# Patient Record
Sex: Female | Born: 1995 | Hispanic: Yes | Marital: Married | Smoking: Never smoker
Health system: Southern US, Community
[De-identification: ages and names within clinical notes are randomized; demographics above are authoritative.]

## PROBLEM LIST (undated history)

## (undated) DIAGNOSIS — R634 Abnormal weight loss: Secondary | ICD-10-CM

## (undated) DIAGNOSIS — F32A Depression, unspecified: Secondary | ICD-10-CM

## (undated) DIAGNOSIS — R109 Unspecified abdominal pain: Secondary | ICD-10-CM

## (undated) HISTORY — DX: Unspecified abdominal pain: R10.9

## (undated) HISTORY — DX: Abnormal weight loss: R63.4

---
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
## 2018-04-16 ENCOUNTER — Ambulatory Visit (HOSPITAL_COMMUNITY)
Admission: EM | Admit: 2018-04-16 | Discharge: 2018-04-16 | Disposition: A | Discharge status: Home / Self Care | Payer: Medicaid Other | Attending: Internal Medicine | Admitting: Internal Medicine

## 2018-04-16 ENCOUNTER — Ambulatory Visit (INDEPENDENT_AMBULATORY_CARE_PROVIDER_SITE_OTHER): Payer: Medicaid Other

## 2018-04-16 ENCOUNTER — Telehealth (HOSPITAL_COMMUNITY): Payer: Self-pay | Admitting: Family Medicine

## 2018-04-16 ENCOUNTER — Encounter (HOSPITAL_COMMUNITY): Payer: Self-pay | Admitting: Family Medicine

## 2018-04-16 DIAGNOSIS — J209 Acute bronchitis, unspecified: Secondary | ICD-10-CM | POA: Diagnosis not present

## 2018-04-16 DIAGNOSIS — R109 Unspecified abdominal pain: Secondary | ICD-10-CM | POA: Diagnosis not present

## 2018-04-16 DIAGNOSIS — Z882 Allergy status to sulfonamides status: Secondary | ICD-10-CM | POA: Diagnosis not present

## 2018-04-16 DIAGNOSIS — R5383 Other fatigue: Secondary | ICD-10-CM | POA: Diagnosis not present

## 2018-04-16 DIAGNOSIS — Z79899 Other long term (current) drug therapy: Secondary | ICD-10-CM | POA: Diagnosis not present

## 2018-04-16 DIAGNOSIS — R05 Cough: Secondary | ICD-10-CM | POA: Diagnosis present

## 2018-04-16 LAB — POCT RAPID STREP A: STREPTOCOCCUS, GROUP A SCREEN (DIRECT): NEGATIVE

## 2018-04-16 MED ORDER — PREDNISONE 50 MG PO TABS: 50.0000 mg | ORAL_TABLET | Freq: Every day | ORAL | 0 refills | Status: AC

## 2018-04-16 MED ORDER — FLUTICASONE PROPIONATE 50 MCG/ACT NA SUSP: 1.0000 | Freq: Every day | NASAL | 0 refills | Status: DC

## 2018-04-16 MED ORDER — ACETAMINOPHEN 325 MG PO TABS
ORAL_TABLET | ORAL | Status: AC
  Filled 2018-04-16: qty 2

## 2018-04-16 MED ORDER — CETIRIZINE HCL 10 MG PO CAPS: 10.0000 mg | ORAL_CAPSULE | Freq: Every day | ORAL | 0 refills | Status: DC

## 2018-04-16 MED ORDER — ALBUTEROL SULFATE HFA 108 (90 BASE) MCG/ACT IN AERS: 1.0000 | INHALATION_SPRAY | Freq: Four times a day (QID) | RESPIRATORY_TRACT | 0 refills | Status: DC | PRN

## 2018-04-16 MED ORDER — IPRATROPIUM-ALBUTEROL 0.5-2.5 (3) MG/3ML IN SOLN
3.0000 mL | Freq: Once | RESPIRATORY_TRACT | Status: AC
  Administered 2018-04-16: 3 mL via RESPIRATORY_TRACT

## 2018-04-16 MED ORDER — PSEUDOEPH-BROMPHEN-DM 30-2-10 MG/5ML PO SYRP: 5.0000 mL | ORAL_SOLUTION | Freq: Four times a day (QID) | ORAL | 0 refills | Status: DC | PRN

## 2018-04-16 MED ORDER — ACETAMINOPHEN 325 MG PO TABS
650.0000 mg | ORAL_TABLET | Freq: Once | ORAL | Status: AC
  Administered 2018-04-16: 650 mg via ORAL

## 2018-04-16 MED ORDER — PREDNISONE 50 MG PO TABS: 50.0000 mg | ORAL_TABLET | Freq: Every day | ORAL | 0 refills | Status: DC

## 2018-04-16 MED ORDER — IPRATROPIUM-ALBUTEROL 0.5-2.5 (3) MG/3ML IN SOLN
RESPIRATORY_TRACT | Status: AC
  Filled 2018-04-16: qty 3

## 2018-04-16 NOTE — ED Triage Notes (Addendum)
Pt here for runny nose, cough, abd pain and coughing up mucous x 3 days. Pt is taking fluconazole, acyclovir, azithromycin and ferrous sulfate. She is being treated for PNA and was seen like a week ago at [facility].

## 2018-04-16 NOTE — Discharge Instructions (Signed)
Please use prednisone daily for the next 5 days to help with asthma and wheezing.  Please use albuterol inhaler as needed for wheezing.  Please begin daily Zyrtec and Flonase nasal spray to help with congestion.  For cough please use cough syrup provided or over-the-counter Delsym or Robitussin.  Please mix honey and warm tea.  Please alternate Tylenol and ibuprofen every 4 hours to control fever and headache.  Please return if symptoms not improving or symptoms are worsening.

## 2018-04-16 NOTE — ED Provider Notes (Signed)
[facility]    CSN: [id number] Arrival date & time: 04/16/18  1519     History   Chief Complaint Chief Complaint  Patient presents with  . Cough  . Abdominal Pain    HPI Denise Mann is a 22 y.o. female presenting today for evaluation of rhinorrhea, cough, mucus production and fatigue.  Symptoms began 3 days ago.  Patient is also having abdominal pain and chest discomfort.  Decreased appetite.  [facility] and is taking fluconazole, acyclovir, azithromycin and ferrous sulfate daily.  She states that the azithromycin is for a month.  Patient and mom are unclear on why she is on these medicines, but states that it is "unconventional" medicine.  Patient has not tried anything for her symptoms.  She denies any nausea or vomiting, denies any constipation or change in bowel movements.  HPI  Past Medical History:  Diagnosis Date  . Abdominal pain, recurrent   . Weight loss     Patient Active Problem List   Diagnosis Date Noted  . 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 Nausea & vomiting 01/29/2012  . Poor appetite 01/29/2012  . Lower abdominal pain   . Weight loss     History reviewed. No pertinent surgical history.  OB History   None      Home Medications    Prior to Admission medications   Medication Sig Start Date End Date Taking? Authorizing Provider  albuterol (PROVENTIL HFA;VENTOLIN HFA) 108 (90 Base) MCG/ACT inhaler Inhale 1-2 puffs into the lungs every 6 (six) hours as needed for wheezing or shortness of breath. 04/16/18   Lilyth Lawyer C, PA-C  brompheniramine-pseudoephedrine-DM 30-2-10 MG/5ML syrup Take 5 mLs by mouth 4 (four) times daily as needed. 04/16/18   Narada Uzzle C, PA-C  Cetirizine HCl 10 MG CAPS Take 1 capsule (10 mg total) by mouth daily for 15 days. 04/16/18 05/01/18  Kaijah Abts C, PA-C  fluticasone (FLONASE) 50 MCG/ACT nasal spray Place 1 spray  into both nostrils daily for 7 days. 04/16/18 04/23/18  Dustine Bertini C, PA-C  Fluticasone-Salmeterol (ADVAIR) 100-50 MCG/DOSE AEPB Inhale 1 puff into the lungs every 12 (twelve) hours.    [provider]  lactase (LACTAID) 3000 UNITS tablet Take 1 tablet (3,000 Units total) by mouth 3 (three) times daily with meals. 08/10/12 08/10/13  Jon Gillslark, Joseph H, MD  levocetirizine (XYZAL) 5 MG tablet Take 5 mg by mouth daily.  01/08/12   [provider]  metoCLOPramide (REGLAN) 10 MG tablet Take 1 tablet (10 mg total) by mouth every 6 (six) hours. 04/28/16   Janne NapoleonNeese, Hope M, NP  montelukast (SINGULAIR) 10 MG tablet Take 10 mg by mouth at bedtime.     [provider]  predniSONE (DELTASONE) 50 MG tablet Take 1 tablet (50 mg total) by mouth daily for 5 days. 04/16/18 04/21/18  Ercel Pepitone, Junius CreamerHallie C, PA-C    Family History History reviewed. No pertinent family history.  Social History Social History   Tobacco Use  . Smoking status: Never Smoker  . Smokeless tobacco: Never Used  Substance Use Topics  . Alcohol use: Not on file  . Drug use: Not on file     Allergies   Sulfa antibiotics   Review of Systems Review of Systems  Constitutional: Positive for fever. Negative for chills and fatigue.  HENT: Positive for congestion, rhinorrhea, sinus pressure and sore throat. Negative for ear pain and trouble swallowing.   Respiratory:  Positive for cough. Negative for chest tightness and shortness of breath.   Cardiovascular: Negative for chest pain.  Gastrointestinal: Positive for abdominal pain. Negative for nausea and vomiting.  Musculoskeletal: Negative for myalgias.  Skin: Negative for rash.  Neurological: Negative for dizziness, light-headedness and headaches.     Physical Exam Triage Vital Signs ED Triage Vitals  Enc Vitals Group     BP 04/16/18 1545 102/85     Pulse Rate 04/16/18 1545 (!) 131     Resp 04/16/18 1545 18     Temp 04/16/18 1545 (!) 102.5 F (39.2 C)     Temp  src --      SpO2 04/16/18 1545 100 %     Weight --      Height --      Head Circumference --      Peak Flow --      Pain Score 04/16/18 1541 5     Pain Loc --      Pain Edu? --      Excl. in GC? --    No data found.  Updated Vital Signs BP 102/85   Pulse (!) 131   Temp (!) 102.5 F (39.2 C)   Resp 18   SpO2 100%   Visual Acuity Right Eye Distance:   Left Eye Distance:   Bilateral Distance:    Right Eye Near:   Left Eye Near:    Bilateral Near:     Physical Exam  Constitutional: She appears well-developed and well-nourished. No distress.  Sitting on exam table, appears slightly uncomfortable, but no acute distress  HENT:  Head: Normocephalic and atraumatic.  Bilateral TMs nonerythematous, nasal mucosa erythematous, clear rhinorrhea present, posterior oropharynx erythematous, no tonsillar enlargement or exudate.  Eyes: Conjunctivae are normal.  Neck: Neck supple.  No cervical lymphadenopathy  Cardiovascular: Normal rate and regular rhythm.  No murmur heard. Pulmonary/Chest: Effort normal and breath sounds normal. No respiratory distress.  Breathing comfortably at rest, mild inconsistent wheezing throughout bilateral lung fields  Abdominal: Soft. There is no tenderness.  Nontender to light and deep palpation  Musculoskeletal: She exhibits no edema.  Neurological: She is alert.  Skin: Skin is warm and dry.  Psychiatric: She has a normal mood and affect.  Nursing note and vitals reviewed.    UC Treatments / Results  Labs (all labs ordered are listed, but only abnormal results are displayed) Labs Reviewed  CULTURE, GROUP A STREP [facility])  POCT RAPID STREP A    EKG None Radiology Dg Chest 2 View  Result Date: 04/16/2018 CLINICAL DATA:  Cough and chest pain EXAM: CHEST - 2 VIEW COMPARISON:  June 05, 2012 FINDINGS: There is no edema or consolidation. The heart size and pulmonary vascularity are normal. No adenopathy. No bone lesions. No pneumothorax. IMPRESSION:  No edema or consolidation. Electronically Signed   By: Bretta Bang III M.D.   On: 04/16/2018 16:58    Procedures Procedures (including critical care time)  Medications Ordered in UC Medications  acetaminophen (TYLENOL) tablet 650 mg (650 mg Oral Given 04/16/18 1550)  ipratropium-albuterol (DUONEB) 0.5-2.5 (3) MG/3ML nebulizer solution 3 mL (3 mLs Nebulization Given 04/16/18 1656)     Initial Impression / Assessment and Plan / UC Course  I have reviewed the triage vital signs and the nursing notes.  Pertinent labs & imaging results that were available during my care of the patient were reviewed by me and considered in my medical decision making (see chart for details).  Strep test negative, chest x-ray negative for pneumonia.  Patient with fever and tachycardia, wheezing on exam, breathing treatment provided in clinic.  Improvement of wheezing after administration.  At this time will begin treatment for bronchitis with albuterol inhaler, prednisone as well as Zyrtec and Flonase for congestion.  Cough syrup provided for cough.  Will have patient monitor temperatures temperature.  Return if symptoms not improving in 3 to 4 days.  Return if symptoms worsening. Discussed strict return precautions. Patient verbalized understanding and is agreeable with plan.   Final Clinical Impressions(s) / UC Diagnoses   Final diagnoses:  Acute bronchitis, unspecified organism    ED Discharge Orders        Ordered    albuterol (PROVENTIL HFA;VENTOLIN HFA) 108 (90 Base) MCG/ACT inhaler  Every 6 hours PRN,   Status:  Discontinued     04/16/18 1712    predniSONE (DELTASONE) 50 MG tablet  Daily,   Status:  Discontinued     04/16/18 1712    Cetirizine HCl 10 MG CAPS  Daily,   Status:  Discontinued     04/16/18 1712    fluticasone (FLONASE) 50 MCG/ACT nasal spray  Daily,   Status:  Discontinued     04/16/18 1712    brompheniramine-pseudoephedrine-DM 30-2-10 MG/5ML syrup  4 times daily PRN,    Status:  Discontinued     04/16/18 1712       Controlled Substance Prescriptions [place] Controlled Substance Registry consulted? Not Applicable   Lew Dawes, [place] 04/17/18 1712

## 2018-04-19 LAB — CULTURE, GROUP A STREP (THRC)

## 2018-11-13 ENCOUNTER — Ambulatory Visit (INDEPENDENT_AMBULATORY_CARE_PROVIDER_SITE_OTHER): Payer: Medicaid Other

## 2018-11-13 ENCOUNTER — Encounter (HOSPITAL_COMMUNITY): Payer: Self-pay

## 2018-11-13 ENCOUNTER — Ambulatory Visit (HOSPITAL_COMMUNITY)
Admission: EM | Admit: 2018-11-13 | Discharge: 2018-11-13 | Disposition: A | Discharge status: Home / Self Care | Payer: Medicaid Other | Attending: Family Medicine | Admitting: Family Medicine

## 2018-11-13 DIAGNOSIS — B9789 Other viral agents as the cause of diseases classified elsewhere: Secondary | ICD-10-CM

## 2018-11-13 DIAGNOSIS — J069 Acute upper respiratory infection, unspecified: Secondary | ICD-10-CM

## 2018-11-13 MED ORDER — DM-GUAIFENESIN ER 30-600 MG PO TB12: 1.0000 | ORAL_TABLET | Freq: Two times a day (BID) | ORAL | 0 refills | Status: DC

## 2018-11-13 MED ORDER — ALBUTEROL SULFATE HFA 108 (90 BASE) MCG/ACT IN AERS: 1.0000 | INHALATION_SPRAY | Freq: Four times a day (QID) | RESPIRATORY_TRACT | 0 refills | Status: DC | PRN

## 2018-11-13 NOTE — ED Provider Notes (Signed)
[facility]    CSN: [id number] Arrival date & time: 11/13/18  1309     History   Chief Complaint Chief Complaint  Patient presents with  . Cough    HPI Ebbie Ridge is a 22 y.o. female.   Patient is a 22 year old female who presents for cough, congestion, shortness of breath, back pain x5 days.  She does have a past medical history of asthma.  She has been using her Q var inhaler with no relief. She is out of her rescue inhaler.   Her symptoms have been constant and worsening.  She has not taken any medication for cough, congestion.  She says some nasal congestion and rhinorrhea.  She denies any specific fever, body aches, chills.  She denies any recent sick contacts.  ROS per HPI      Past Medical History:  Diagnosis Date  . Abdominal pain, recurrent   . Weight loss     Patient Active Problem List   Diagnosis Date Noted  . 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 Nausea & vomiting 01/29/2012  . Poor appetite 01/29/2012  . Lower abdominal pain   . Weight loss     History reviewed. No pertinent surgical history.  OB History   None      Home Medications    Prior to Admission medications   Medication Sig Start Date End Date Taking? Authorizing Provider  albuterol (PROVENTIL HFA;VENTOLIN HFA) 108 (90 Base) MCG/ACT inhaler Inhale 1-2 puffs into the lungs every 6 (six) hours as needed for wheezing or shortness of breath. 11/13/18   Dahlia Byes A, NP  brompheniramine-pseudoephedrine-DM 30-2-10 MG/5ML syrup Take 5 mLs by mouth 4 (four) times daily as needed. 04/16/18   Wieters, Hallie C, PA-C  Cetirizine HCl 10 MG CAPS Take 1 capsule (10 mg total) by mouth daily for 15 days. 04/16/18 05/01/18  Wieters, Hallie C, PA-C  dextromethorphan-guaiFENesin (MUCINEX DM) 30-600 MG 12hr tablet Take 1 tablet by mouth 2 (two) times daily. 11/13/18   Tamyra Fojtik, Gloris Manchester A, NP  fluticasone (FLONASE) 50 MCG/ACT nasal spray Place 1  spray into both nostrils daily for 7 days. 04/16/18 04/23/18  Wieters, Hallie C, PA-C  Fluticasone-Salmeterol (ADVAIR) 100-50 MCG/DOSE AEPB Inhale 1 puff into the lungs every 12 (twelve) hours.    [provider]  lactase (LACTAID) 3000 UNITS tablet Take 1 tablet (3,000 Units total) by mouth 3 (three) times daily with meals. 08/10/12 08/10/13  Jon Gills, MD  levocetirizine (XYZAL) 5 MG tablet Take 5 mg by mouth daily.  01/08/12   [provider]  metoCLOPramide (REGLAN) 10 MG tablet Take 1 tablet (10 mg total) by mouth every 6 (six) hours. 04/28/16   Janne Napoleon, NP  montelukast (SINGULAIR) 10 MG tablet Take 10 mg by mouth at bedtime.     [provider]    Family History Family History  Problem Relation Age of Onset  . Hypertension Mother   . Healthy Father     Social History Social History   Tobacco Use  . Smoking status: Never Smoker  . Smokeless tobacco: Never Used  Substance Use Topics  . Alcohol use: Not on file  . Drug use: Not on file     Allergies   Sulfa antibiotics   Review of Systems Review of Systems   Physical Exam Triage Vital Signs ED Triage Vitals  Enc Vitals Group     BP 11/13/18 1336 102/67     Pulse Rate  11/13/18 1336 73     Resp 11/13/18 1336 20     Temp 11/13/18 1336 98.3 F (36.8 C)     Temp src --      SpO2 11/13/18 1336 99 %     Weight --      Height --      Head Circumference --      Peak Flow --      Pain Score 11/13/18 1333 5     Pain Loc --      Pain Edu? --      Excl. in GC? --    No data found.  Updated Vital Signs BP 102/67   Pulse 73   Temp 98.3 F (36.8 C)   Resp 20   LMP 11/06/2018   SpO2 99%   Visual Acuity Right Eye Distance:   Left Eye Distance:   Bilateral Distance:    Right Eye Near:   Left Eye Near:    Bilateral Near:     Physical Exam  Constitutional: She appears well-developed and well-nourished.  HENT:  Head: Normocephalic and atraumatic.  Right Ear: External ear  normal.  Left Ear: External ear normal.  Nose: Nose normal.  Mouth/Throat: Oropharynx is clear and moist. No oropharyngeal exudate.  Eyes: Conjunctivae are normal.  Neck: Normal range of motion.  Cardiovascular: Normal rate, regular rhythm and normal heart sounds.  Pulmonary/Chest: Effort normal. No respiratory distress. She has no wheezes. She has no rales.  Decreased lower lung sounds, bilateral.   Musculoskeletal: Normal range of motion.  Lymphadenopathy:    She has no cervical adenopathy.  Neurological: She is alert.  Skin: Skin is warm and dry.  Psychiatric: She has a normal mood and affect.  Nursing note and vitals reviewed.    UC Treatments / Results  Labs (all labs ordered are listed, but only abnormal results are displayed) Labs Reviewed - No data to display  EKG None  Radiology Dg Chest 2 View  Result Date: 11/13/2018 CLINICAL DATA:  Shortness of breath, back pain, cough EXAM: CHEST - 2 VIEW COMPARISON:  04/16/2018 FINDINGS: Heart and mediastinal contours are within normal limits. No focal opacities or effusions. No acute bony abnormality. IMPRESSION: No active cardiopulmonary disease. Electronically Signed   By: Charlett NoseKevin  Dover M.D.   On: 11/13/2018 14:41    Procedures Procedures (including critical care time)  Medications Ordered in UC Medications - No data to display  Initial Impression / Assessment and Plan / UC Course  I have reviewed the triage vital signs and the nursing notes.  Pertinent labs & imaging results that were available during my care of the patient were reviewed by me and considered in my medical decision making (see chart for details).     X-ray was negative for any pneumonia, bronchitis. This is most likely a viral illness that is flaring her asthma. She has been using her Qvar inhaler without any relief We will give her an albuterol rescue inhaler to use for cough, wheezing, shortness of breath. Mucinex DM for cough,  congestion Instructed to continue her allergy medication Follow up as needed for continued or worsening symptoms  Final Clinical Impressions(s) / UC Diagnoses   Final diagnoses:  Viral URI with cough     Discharge Instructions     Your x ray was normal.  I believe this is viral related. Mucinex can help with the cough and congestion.  Keep using your inhalers as prescribed.  Keep using the allergy medication.  Follow up  as needed for continued or worsening symptoms     ED Prescriptions    Medication Sig Dispense Auth. Provider   albuterol (PROVENTIL HFA;VENTOLIN HFA) 108 (90 Base) MCG/ACT inhaler Inhale 1-2 puffs into the lungs every 6 (six) hours as needed for wheezing or shortness of breath. 1 Inhaler Hartleton Ducre A, NP   dextromethorphan-guaiFENesin (MUCINEX DM) 30-600 MG 12hr tablet Take 1 tablet by mouth 2 (two) times daily. 15 tablet Dahlia Byes A, NP     Controlled Substance Prescriptions  Controlled Substance Registry consulted? Not Applicable   Janace Aris, NP 11/13/18 1514

## 2018-11-13 NOTE — Discharge Instructions (Addendum)
Your x ray was normal.  I believe this is viral related. Mucinex can help with the cough and congestion.  Keep using your inhalers as prescribed.  Keep using the allergy medication.  Follow up as needed for continued or worsening symptoms

## 2018-11-13 NOTE — ED Triage Notes (Signed)
Pt presents with cough x 5 days. Reports inhaler is not helping at home.

## 2019-07-31 ENCOUNTER — Encounter (HOSPITAL_COMMUNITY): Payer: Self-pay

## 2019-07-31 ENCOUNTER — Other Ambulatory Visit: Payer: Self-pay

## 2019-07-31 ENCOUNTER — Emergency Department (HOSPITAL_COMMUNITY)
Admission: EM | Admit: 2019-07-31 | Discharge: 2019-07-31 | Disposition: A | Discharge status: Home / Self Care | Payer: BC Managed Care – PPO | Attending: Emergency Medicine | Admitting: Emergency Medicine

## 2019-07-31 DIAGNOSIS — R311 Benign essential microscopic hematuria: Secondary | ICD-10-CM

## 2019-07-31 DIAGNOSIS — Z202 Contact with and (suspected) exposure to infections with a predominantly sexual mode of transmission: Secondary | ICD-10-CM | POA: Diagnosis not present

## 2019-07-31 DIAGNOSIS — R1033 Periumbilical pain: Secondary | ICD-10-CM | POA: Insufficient documentation

## 2019-07-31 DIAGNOSIS — N390 Urinary tract infection, site not specified: Secondary | ICD-10-CM | POA: Insufficient documentation

## 2019-07-31 DIAGNOSIS — R10814 Left lower quadrant abdominal tenderness: Secondary | ICD-10-CM | POA: Diagnosis not present

## 2019-07-31 DIAGNOSIS — R350 Frequency of micturition: Secondary | ICD-10-CM | POA: Diagnosis present

## 2019-07-31 DIAGNOSIS — R300 Dysuria: Secondary | ICD-10-CM | POA: Insufficient documentation

## 2019-07-31 LAB — WET PREP, GENITAL
Clue Cells Wet Prep HPF POC: NONE SEEN
Sperm: NONE SEEN
Trich, Wet Prep: NONE SEEN
Yeast Wet Prep HPF POC: NONE SEEN

## 2019-07-31 LAB — URINALYSIS, ROUTINE W REFLEX MICROSCOPIC
Bilirubin Urine: NEGATIVE
Glucose, UA: NEGATIVE mg/dL
Ketones, ur: NEGATIVE mg/dL
Nitrite: NEGATIVE
Protein, ur: NEGATIVE mg/dL
Specific Gravity, Urine: 1.024 (ref 1.005–1.030)
WBC, UA: >50 WBC/hpf — ABNORMAL HIGH (ref 0–5)
pH: 6 (ref 5.0–8.0)

## 2019-07-31 LAB — I-STAT BETA HCG BLOOD, ED (MC, WL, AP ONLY): I-stat hCG, quantitative: <5 m[IU]/mL (ref <5)

## 2019-07-31 LAB — RPR: RPR Ser Ql: NONREACTIVE

## 2019-07-31 LAB — HIV ANTIBODY (ROUTINE TESTING W REFLEX): HIV Screen 4th Generation wRfx: NONREACTIVE

## 2019-07-31 MED ORDER — CEPHALEXIN 500 MG PO CAPS: 500.0000 mg | ORAL_CAPSULE | Freq: Two times a day (BID) | ORAL | 0 refills | Status: AC

## 2019-07-31 NOTE — ED Provider Notes (Signed)
[facility]-EMERGENCY DEPT Provider Note   CSN: [id number] Arrival date & time: 07/31/19  [id number]    History   Chief Complaint Chief Complaint  Patient presents with  . Urinary Frequency    HPI Denise Mann is a 23 y.o. female who presents to the ED today complaining of gradual onset, intermittent, throbbing, suprapubic abdominal pain, urinary frequency, urgency, and dysuria that began this morning. Pt also complains of a constant burning to her vaginal area. Pt reports she has had a UTI in the past and this feels similar. She is sexually active with 1 female partner; they do not use protection. She would like to be tested for STIs today. LNMP 1 week ago. Denies fever, chills, nausea, vomiting, diarrhea, constipation, vaginal discharge, pelvic pain, or any other associated symptoms.        Past Medical History:  Diagnosis Date  . Abdominal pain, recurrent   . Weight loss     Patient Active Problem List   Diagnosis Date Noted  . 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 Nausea & vomiting 01/29/2012  . Poor appetite 01/29/2012  . Lower abdominal pain   . Weight loss     No past surgical history on file.   OB History   No obstetric history on file.      Home Medications    Prior to Admission medications   Medication Sig Start Date End Date Taking? Authorizing Provider  Norethindrone-Ethinyl Estradiol-Fe Biphas (LO LOESTRIN FE) 1 MG-10 MCG / 10 MCG tablet Take 1 tablet by mouth daily.    Yes [provider]  albuterol (PROVENTIL HFA;VENTOLIN HFA) 108 (90 Base) MCG/ACT inhaler Inhale 1-2 puffs into the lungs every 6 (six) hours as needed for wheezing or shortness of breath. Patient not taking: Reported on 07/31/2019 11/13/18   Dahlia ByesBast, Traci A, NP  brompheniramine-pseudoephedrine-DM 30-2-10 MG/5ML syrup Take 5 mLs by mouth 4 (four) times daily as needed. Patient not taking: Reported on 07/31/2019 04/16/18    Wieters, Hallie C, PA-C  cephALEXin (KEFLEX) 500 MG capsule Take 1 capsule (500 mg total) by mouth 2 (two) times daily for 7 days. 07/31/19 08/07/19  Tanda RockersVenter, Starletta Houchin, PA-C  Cetirizine HCl 10 MG CAPS Take 1 capsule (10 mg total) by mouth daily for 15 days. Patient not taking: Reported on 07/31/2019 04/16/18 05/01/18  Wieters, Junius CreamerHallie C, PA-C  dextromethorphan-guaiFENesin (MUCINEX DM) 30-600 MG 12hr tablet Take 1 tablet by mouth 2 (two) times daily. Patient not taking: Reported on 07/31/2019 11/13/18   Dahlia ByesBast, Traci A, NP  fluticasone (FLONASE) 50 MCG/ACT nasal spray Place 1 spray into both nostrils daily for 7 days. Patient not taking: Reported on 07/31/2019 04/16/18 04/23/18  Wieters, Hallie C, PA-C  lactase (LACTAID) 3000 UNITS tablet Take 1 tablet (3,000 Units total) by mouth 3 (three) times daily with meals. Patient not taking: Reported on 07/31/2019 08/10/12 08/10/13  Jon Gillslark, Joseph H, MD  metoCLOPramide (REGLAN) 10 MG tablet Take 1 tablet (10 mg total) by mouth every 6 (six) hours. Patient not taking: Reported on 07/31/2019 04/28/16   Janne NapoleonNeese, Hope M, NP    Family History Family History  Problem Relation Age of Onset  . Hypertension Mother   . Healthy Father     Social History Social History   Tobacco Use  . Smoking status: Never Smoker  . Smokeless tobacco: Never Used  Substance Use Topics  . Alcohol use: Never    Frequency: Never  . Drug use: Never  Allergies   Sulfa antibiotics   Review of Systems Review of Systems  Constitutional: Negative for chills and fever.  HENT: Negative for congestion.   Eyes: Negative for visual disturbance.  Respiratory: Negative for cough.   Cardiovascular: Negative for chest pain.  Gastrointestinal: Positive for abdominal pain. Negative for constipation, diarrhea, nausea and vomiting.  Genitourinary: Positive for dysuria, frequency and urgency. Negative for pelvic pain, vaginal bleeding and vaginal discharge.  Musculoskeletal: Negative for myalgias.  Skin:  Negative for rash.  Neurological: Negative for headaches.     Physical Exam Updated Vital Signs BP 110/77 (BP Location: Left Arm)   Pulse 76   Temp 98.2 F (36.8 C) (Oral)   Resp 16   Ht 4\' 11"  (1.499 m)   Wt 45.4 kg   LMP 07/24/2019 (Approximate)   SpO2 94%   BMI 20.20 kg/m   Physical Exam Vitals signs and nursing note reviewed.  Constitutional:      Appearance: She is not ill-appearing.  HENT:     Head: Normocephalic and atraumatic.  Eyes:     Conjunctiva/sclera: Conjunctivae normal.  Neck:     Musculoskeletal: Neck supple.  Cardiovascular:     Rate and Rhythm: Normal rate and regular rhythm.  Pulmonary:     Effort: Pulmonary effort is normal.     Breath sounds: Normal breath sounds.  Abdominal:     Palpations: Abdomen is soft.     Tenderness: There is abdominal tenderness. There is no guarding or rebound.     Comments: Soft abdomen, tenderness present to LLQ and suprapubic area, +BS throughout, no r/g/r, neg murphy's, neg mcburney's, no CVA TTP   Genitourinary:    Comments: Chaperone present for exam. No rashes, lesions, or tenderness to external genitalia. No erythema, injury, or tenderness to vaginal mucosa. No vaginal discharge or bleeding within vaginal vault. No adnexal masses, tenderness, or fullness. No CMT, cervical friability, or discharge from cervical os. Cervical os is closed. Uterus non-deviated, mobile, nonTTP, and without enlargement.   Skin:    General: Skin is warm and dry.  Neurological:     Mental Status: She is alert.      ED Treatments / Results  Labs (all labs ordered are listed, but only abnormal results are displayed) Labs Reviewed  WET PREP, GENITAL - Abnormal; Notable for the following components:      Result Value   WBC, Wet Prep HPF POC MODERATE (*)    All other components within normal limits  URINALYSIS, ROUTINE W REFLEX MICROSCOPIC - Abnormal; Notable for the following components:   APPearance HAZY (*)    Hgb urine  dipstick LARGE (*)    Leukocytes,Ua SMALL (*)    WBC, UA >50 (*)    Bacteria, UA RARE (*)    All other components within normal limits  URINE CULTURE  HIV ANTIBODY (ROUTINE TESTING W REFLEX)  RPR  I-STAT BETA HCG BLOOD, ED ([facility], [facility], [facility] ONLY)  GC/CHLAMYDIA PROBE AMP ([place]) NOT AT [facility]    EKG None  Radiology No results found.  Procedures Procedures (including critical care time)  Medications Ordered in ED Medications - No data to display   Initial Impression / Assessment and Plan / ED Course  I have reviewed the triage vital signs and the nursing notes.  Pertinent labs & imaging results that were available during my care of the patient were reviewed by me and considered in my medical decision making (see chart for details).    23 year old female  presenting to the ED today with complaints of suprapubic pain, dysuria, frequency, and a burning sensation to her vaginal area x 1 day. She is sexually active with 1 female partner - does not use protection. Would like to be tested for STIs today. No complaints of pelvic pain to warrant pelvic ultrasound today although will reevaluate during pelvic exam. No peritoneal signs; doubt acute abdomen today. Will obtain U/A as well as STI panel.   U/A with small leuks, > 50 WBCs per HPF, and 21-50 RBCs per HPF. Pt recently stopped her menses; suspect blood is present due to that. She has no CVA tenderness on exam. No nausea, vomiting. No hx of kidney stones. Very low suspicion for same. Will send for culture and treat as UTI today with keflex. Wet prep negative today. Pt advised she will receive a call in 2-3 days if she tests positive for STIs. She is to follow up with her PCP. She is in agreement with plan at this time and stable for discharge home.        Final Clinical Impressions(s) / ED Diagnoses   Final diagnoses:  Lower urinary tract infectious disease  Dysuria  Benign essential microscopic hematuria    ED Discharge Orders          Ordered    cephALEXin (KEFLEX) 500 MG capsule  2 times daily     07/31/19 0926           Eustaquio Maize, PA-C 07/31/19 1607    Julianne Rice, MD 08/06/19 1751

## 2019-07-31 NOTE — Discharge Instructions (Signed)
You were seen in the ED today for burning when you pee and peeing more often than normal  We have tested you for STIs today - you will receive a call in 2-3 days if any of the tests return positive. If they are negative you will not receive a call.  Your urine appeared infectious today. We have sent it for culture to see what bacteria it grows/if the antibiotic you are prescribed is affective. You will receive a call in 2-3 days if it needs to be changed.  Please take antibiotic as prescribed.  If no improvement in symptoms please follow up with your PCP.  Return to the ED immediately for any worsening symptoms including worsening pain, nausea, vomiting, fever > 100.4, pelvic pain.

## 2019-07-31 NOTE — ED Triage Notes (Signed)
Pt presents with c/o urinary frequency and urgency that started this morning. Pt reports burning when she urinates. Pt denies any hematuria or hx of kidney stones.

## 2019-08-02 LAB — URINE CULTURE: Culture: >=100000 — AB

## 2019-08-03 ENCOUNTER — Telehealth: Payer: Self-pay | Admitting: Emergency Medicine

## 2019-08-03 LAB — GC/CHLAMYDIA PROBE AMP (~~LOC~~) NOT AT ARMC
Chlamydia: NEGATIVE
Neisseria Gonorrhea: NEGATIVE

## 2019-08-03 NOTE — Telephone Encounter (Signed)
Post ED Visit - Positive Culture Follow-up  Culture report reviewed by antimicrobial stewardship pharmacist: [facility] Team []  Elenor Quinones, Pharm.D. []  Heide Guile, Pharm.D., BCPS AQ-ID []  Parks Neptune, Pharm.D., BCPS []  Alycia Rossetti, Pharm.D., BCPS []  [place], [place].D., BCPS, AAHIVP []  Legrand Como, Pharm.D., BCPS, AAHIVP []  Salome Arnt, PharmD, BCPS []  Johnnette Gourd, PharmD, BCPS []  Hughes Better, PharmD, BCPS []  Leeroy Cha, PharmD []  Laqueta Linden, PharmD, BCPS []  Albertina Parr, PharmD  [facility] Team []  Leodis Sias, PharmD []  Lindell Spar, PharmD []  Royetta Asal, PharmD []  Graylin Shiver, Rph []  Rema Fendt) Glennon Mac, PharmD []  Arlyn Dunning, PharmD []  Netta Cedars, PharmD []  Dia Sitter, PharmD []  Leone Haven, PharmD []  Gretta Arab, PharmD []  Theodis Shove, PharmD []  Peggyann Juba, PharmD [x]  Reuel Boom, PharmD   Positive urine culture Treated with cephalexin, organism sensitive to the same and no further patient follow-up is required at this time.  Hazle Nordmann 08/03/2019, 12:49 PM

## 2019-09-04 ENCOUNTER — Ambulatory Visit (HOSPITAL_COMMUNITY)
Admission: EM | Admit: 2019-09-04 | Discharge: 2019-09-04 | Disposition: A | Discharge status: Home / Self Care | Payer: BC Managed Care – PPO | Attending: Urgent Care | Admitting: Urgent Care

## 2019-09-04 ENCOUNTER — Other Ambulatory Visit: Payer: Self-pay

## 2019-09-04 ENCOUNTER — Encounter (HOSPITAL_COMMUNITY): Payer: Self-pay | Admitting: Emergency Medicine

## 2019-09-04 DIAGNOSIS — J029 Acute pharyngitis, unspecified: Secondary | ICD-10-CM | POA: Diagnosis not present

## 2019-09-04 DIAGNOSIS — Z711 Person with feared health complaint in whom no diagnosis is made: Secondary | ICD-10-CM

## 2019-09-04 DIAGNOSIS — R067 Sneezing: Secondary | ICD-10-CM | POA: Diagnosis present

## 2019-09-04 DIAGNOSIS — Z882 Allergy status to sulfonamides status: Secondary | ICD-10-CM | POA: Diagnosis not present

## 2019-09-04 DIAGNOSIS — Z20828 Contact with and (suspected) exposure to other viral communicable diseases: Secondary | ICD-10-CM | POA: Insufficient documentation

## 2019-09-04 LAB — POCT RAPID STREP A: Streptococcus, Group A Screen (Direct): NEGATIVE

## 2019-09-04 MED ORDER — NAPROXEN 500 MG PO TABS: 500.0000 mg | ORAL_TABLET | Freq: Two times a day (BID) | ORAL | 0 refills | Status: DC

## 2019-09-04 NOTE — ED Provider Notes (Signed)
MRN: [id number] DOB: [birth year]  Subjective:   Denise Mann is a 23 y.o. female presenting for 1 day history of mild to moderate persistent throat pain, sneezing and stuffy ears.  Patient also has concern about cold sore, went to a different urgent care clinic and was told she had this.  She was started on acyclovir but states that she did not have any kind of relief.  Denies blisters, pain over her lips, swelling, drainage.  Denies any COVID 19 contacts.  Patient works at [employer].  No current facility-administered medications for this encounter.   Current Outpatient Medications:  .  ACYCLOVIR PO, Take by mouth., Disp: , Rfl:  .  Norethindrone-Ethinyl Estradiol-Fe Biphas (LO LOESTRIN FE) 1 MG-10 MCG / 10 MCG tablet, Take 1 tablet by mouth daily. , Disp: , Rfl:    Allergies  Allergen Reactions  . Sulfa Antibiotics Rash    Past Medical History:  Diagnosis Date  . Abdominal pain, recurrent   . Weight loss      History reviewed. No pertinent surgical history.  Review of Systems  Constitutional: Negative for fever and malaise/fatigue.  HENT: Positive for sore throat. Negative for congestion, ear pain and sinus pain.   Eyes: Negative for blurred vision, double vision, discharge and redness.  Respiratory: Negative for cough, hemoptysis, shortness of breath and wheezing.   Cardiovascular: Negative for chest pain.  Gastrointestinal: Negative for abdominal pain, diarrhea, nausea and vomiting.  Genitourinary: Negative for dysuria, flank pain and hematuria.  Musculoskeletal: Negative for myalgias.  Skin: Negative for rash.  Neurological: Negative for dizziness, weakness and headaches.  Psychiatric/Behavioral: Negative for depression and substance abuse.    Objective:   Vitals: BP 114/78 (BP Location: Right Arm) Comment (BP Location): small adult cuff  Pulse 80   Temp 98.6 F (37 C) (Oral)   Resp 16   LMP 09/04/2019   SpO2 98%   Physical Exam Constitutional:    General: She is not in acute distress.    Appearance: Normal appearance. She is well-developed. She is not ill-appearing, toxic-appearing or diaphoretic.  HENT:     Head: Normocephalic and atraumatic.     Comments: TMs slightly opacified but otherwise normal.    Right Ear: Ear canal normal. No drainage or tenderness. No middle ear effusion. Tympanic membrane is not erythematous.     Left Ear: Ear canal normal. No drainage or tenderness.  No middle ear effusion. Tympanic membrane is not erythematous.     Nose: Nose normal. No congestion or rhinorrhea.     Mouth/Throat:     Mouth: Mucous membranes are moist. No oral lesions.     Pharynx: No pharyngeal swelling, oropharyngeal exudate, posterior oropharyngeal erythema or uvula swelling.     Tonsils: No tonsillar exudate or tonsillar abscesses.     Comments: Postnasal drainage in oropharynx. Eyes:     General: No scleral icterus.    Extraocular Movements: Extraocular movements intact.     Right eye: Normal extraocular motion.     Left eye: Normal extraocular motion.     Conjunctiva/sclera: Conjunctivae normal.     Pupils: Pupils are equal, round, and reactive to light.  Neck:     Musculoskeletal: Normal range of motion and neck supple.  Cardiovascular:     Rate and Rhythm: Normal rate and regular rhythm.     Pulses: Normal pulses.     Heart sounds: Normal heart sounds. No murmur. No friction rub. No gallop.   Pulmonary:     Effort: Pulmonary effort  is normal. No respiratory distress.     Breath sounds: Normal breath sounds. No stridor. No wheezing, rhonchi or rales.  Lymphadenopathy:     Cervical: No cervical adenopathy.  Skin:    General: Skin is warm and dry.     Findings: No rash.  Neurological:     General: No focal deficit present.     Mental Status: She is alert and oriented to person, place, and time.  Psychiatric:        Mood and Affect: Mood normal.        Behavior: Behavior normal.        Thought Content: Thought content  normal.      Results for orders placed or performed during the hospital encounter of 09/04/19 (from the past 24 hour(s))  POCT rapid strep A [facility])     Status: None   Collection Time: 09/04/19 10:43 AM  Result Value Ref Range   Streptococcus, Group A Screen (Direct) NEGATIVE NEGATIVE    Assessment and Plan :   1. Sore throat   2. Sneezing     Strep culture pending, will manage for viral illness. Counseled patient on nature of COVID-19 including modes of transmission, diagnostic testing, management and supportive care.  Offered symptomatic relief. COVID 19 testing is pending.  Patient does not have any lip lesions suspicious for HSV.  Reviewed signs and symptoms of this and offered treatment should she develop this.  Patient will keep us posted.  Counseled patient on potential for adverse effects with medications prescribed/recommended today, ER and return-to-clinic precautions discussed, patient verbalized understanding.     Wallis BambergMani, Antwonette Feliz, PA-C 09/04/19 1120

## 2019-09-04 NOTE — Discharge Instructions (Addendum)

## 2019-09-04 NOTE — ED Triage Notes (Signed)
Sore throat started yesterday.  Cold sore started on Monday 08/30/2019.   Denies runny nose Denies coiugh Denies fever Patient sneezes and has stuffy ears.

## 2019-09-05 ENCOUNTER — Emergency Department (HOSPITAL_COMMUNITY)
Admission: EM | Admit: 2019-09-05 | Discharge: 2019-09-05 | Discharge status: Against Medical Advice | Payer: BC Managed Care – PPO

## 2019-09-05 ENCOUNTER — Other Ambulatory Visit: Payer: Self-pay

## 2019-09-05 LAB — NOVEL CORONAVIRUS, NAA (HOSP ORDER, SEND-OUT TO REF LAB; TAT 18-24 HRS): SARS-CoV-2, NAA: NOT DETECTED

## 2019-09-06 ENCOUNTER — Encounter (HOSPITAL_COMMUNITY): Payer: Self-pay

## 2019-09-07 LAB — CULTURE, GROUP A STREP (THRC)

## 2019-12-26 ENCOUNTER — Ambulatory Visit (HOSPITAL_COMMUNITY)
Admission: EM | Admit: 2019-12-26 | Discharge: 2019-12-26 | Disposition: A | Discharge status: Home / Self Care | Payer: BC Managed Care – PPO | Attending: Urgent Care | Admitting: Urgent Care

## 2019-12-26 ENCOUNTER — Encounter (HOSPITAL_COMMUNITY): Payer: Self-pay

## 2019-12-26 ENCOUNTER — Other Ambulatory Visit: Payer: Self-pay

## 2019-12-26 DIAGNOSIS — Z882 Allergy status to sulfonamides status: Secondary | ICD-10-CM | POA: Diagnosis not present

## 2019-12-26 DIAGNOSIS — Z7251 High risk heterosexual behavior: Secondary | ICD-10-CM | POA: Diagnosis present

## 2019-12-26 DIAGNOSIS — J069 Acute upper respiratory infection, unspecified: Secondary | ICD-10-CM | POA: Diagnosis present

## 2019-12-26 DIAGNOSIS — J4530 Mild persistent asthma, uncomplicated: Secondary | ICD-10-CM | POA: Insufficient documentation

## 2019-12-26 DIAGNOSIS — Z113 Encounter for screening for infections with a predominantly sexual mode of transmission: Secondary | ICD-10-CM

## 2019-12-26 DIAGNOSIS — Z20822 Contact with and (suspected) exposure to covid-19: Secondary | ICD-10-CM | POA: Insufficient documentation

## 2019-12-26 DIAGNOSIS — Z79899 Other long term (current) drug therapy: Secondary | ICD-10-CM | POA: Diagnosis not present

## 2019-12-26 DIAGNOSIS — Z833 Family history of diabetes mellitus: Secondary | ICD-10-CM | POA: Insufficient documentation

## 2019-12-26 DIAGNOSIS — Z791 Long term (current) use of non-steroidal anti-inflammatories (NSAID): Secondary | ICD-10-CM | POA: Diagnosis not present

## 2019-12-26 MED ORDER — ALBUTEROL SULFATE HFA 108 (90 BASE) MCG/ACT IN AERS: 1.0000 | INHALATION_SPRAY | Freq: Four times a day (QID) | RESPIRATORY_TRACT | 0 refills | Status: DC | PRN

## 2019-12-26 MED ORDER — BENZONATATE 100 MG PO CAPS: 100.0000 mg | ORAL_CAPSULE | Freq: Three times a day (TID) | ORAL | 0 refills | Status: DC | PRN

## 2019-12-26 MED ORDER — PROMETHAZINE-DM 6.25-15 MG/5ML PO SYRP: 5.0000 mL | ORAL_SOLUTION | Freq: Every evening | ORAL | 0 refills | Status: DC | PRN

## 2019-12-26 MED ORDER — PREDNISONE 20 MG PO TABS: ORAL_TABLET | ORAL | 0 refills | Status: DC

## 2019-12-26 NOTE — ED Triage Notes (Addendum)
Patient presents to Urgent Care with complaints of productive cough since 4 days ago. Patient reports she has not taken any medication for it.  Pt would also like STD testing, denies symptoms of infection but endorses unprotected intercourse.

## 2019-12-26 NOTE — Discharge Instructions (Signed)
We will manage this as a viral syndrome. For sore throat or cough try using a honey-based tea. Use 3 teaspoons of honey with juice squeezed from half lemon. Place shaved pieces of ginger into 1/2-1 cup of water and warm over stove top. Then mix the ingredients and repeat every 4 hours as needed. Please take Tylenol 500mg  every 6 hours. Hydrate very well with at least 2 liters of water. Eat light meals such as soups to replenish electrolytes and soft fruits, veggies. Start an antihistamine like Zyrtec (cetirizine) at 10mg  daily for postnasal drainage, sinus congestion.  You can take this together with pseudoephedrine (Sudafed) at a dose of 60 mg 3 times a day or twice daily as needed for the same kind of congestion.  Starting taking Sudafed after you finish the prednisone course.

## 2019-12-26 NOTE — ED Provider Notes (Signed)
[place]   MRN: [id number] DOB: 14-Mar-1996  Subjective:   Denise Mann is a 24 y.o. female presenting for 4-day history of progressively worsening persistent productive cough with mild nasal congestion.  Patient states she is also had intermittent shortness of breath.  Has a history of asthma but is not using her inhaler.  Would like a refill for this.  She denies any known COVID-19 contacts.  Also would like to get tested for STIs, has unprotected sex.  Denies any particular symptoms, just wants a general screening.  No current facility-administered medications for this encounter.  Current Outpatient Medications:  .  ACYCLOVIR PO, Take by mouth., Disp: , Rfl:  .  naproxen (NAPROSYN) 500 MG tablet, Take 1 tablet (500 mg total) by mouth 2 (two) times daily., Disp: 30 tablet, Rfl: 0 .  Norethindrone-Ethinyl Estradiol-Fe Biphas (LO LOESTRIN FE) 1 MG-10 MCG / 10 MCG tablet, Take 1 tablet by mouth daily. , Disp: , Rfl:    Allergies  Allergen Reactions  . Sulfa Antibiotics Rash    Past Medical History:  Diagnosis Date  . Abdominal pain, recurrent   . Weight loss      History reviewed. No pertinent surgical history.  Family History  Problem Relation Age of Onset  . Hypertension Mother   . Diabetes Mother   . Asthma Mother   . Healthy Father     Social History   Tobacco Use  . Smoking status: Never Smoker  . Smokeless tobacco: Never Used  Substance Use Topics  . Alcohol use: Never  . Drug use: Never    Review of Systems  Constitutional: Negative for fever and malaise/fatigue.  HENT: Positive for congestion. Negative for ear pain, sinus pain and sore throat.   Eyes: Negative for discharge and redness.  Respiratory: Positive for cough and shortness of breath. Negative for hemoptysis and wheezing.   Cardiovascular: Negative for chest pain.  Gastrointestinal: Negative for abdominal pain, diarrhea, nausea and vomiting.  Genitourinary: Negative for  dysuria, flank pain and hematuria.  Musculoskeletal: Negative for myalgias.  Skin: Negative for rash.  Neurological: Negative for dizziness, weakness and headaches.  Psychiatric/Behavioral: Negative for depression and substance abuse.     Objective:   Vitals: BP 112/72 (BP Location: Left Arm)   Pulse 70   Temp 99 F (37.2 C) (Oral)   Resp 16   SpO2 99%   Physical Exam Constitutional:      General: She is not in acute distress.    Appearance: Normal appearance. She is well-developed. She is not ill-appearing, toxic-appearing or diaphoretic.  HENT:     Head: Normocephalic and atraumatic.     Nose: Congestion present.     Mouth/Throat:     Mouth: Mucous membranes are moist.     Pharynx: No oropharyngeal exudate or posterior oropharyngeal erythema.     Comments: Pnd in oropharynx. Eyes:     Extraocular Movements: Extraocular movements intact.     Pupils: Pupils are equal, round, and reactive to light.  Cardiovascular:     Rate and Rhythm: Normal rate and regular rhythm.     Pulses: Normal pulses.     Heart sounds: Normal heart sounds. No murmur. No friction rub. No gallop.   Pulmonary:     Effort: Pulmonary effort is normal. No respiratory distress.     Breath sounds: No stridor. Wheezing (inspiratory, upper-mid lung fields) present. No rhonchi or rales.  Skin:    General: Skin is warm and dry.  Findings: No rash.  Neurological:     Mental Status: She is alert and oriented to person, place, and time.  Psychiatric:        Mood and Affect: Mood normal.        Behavior: Behavior normal.        Thought Content: Thought content normal.        Judgment: Judgment normal.     Assessment and Plan :   1. Viral URI with cough   2. Mild persistent asthma without complication   3. Unprotected sex     Use prednisone course for wheezing, shob in setting of asthma. Schedule albuterol inhaler. Will manage for viral illness such as viral URI, viral rhinitis, possible COVID-19.  Counseled patient on nature of COVID-19 including modes of transmission, diagnostic testing, management and supportive care.  Offered symptomatic relief. COVID 19 testing is pending. STI testing pending. Counseled patient on potential for adverse effects with medications prescribed/recommended today, ER and return-to-clinic precautions discussed, patient verbalized understanding.     Wallis Bamberg, [place] 12/26/19 [id number]

## 2019-12-27 ENCOUNTER — Telehealth (HOSPITAL_COMMUNITY): Payer: Self-pay

## 2019-12-27 LAB — NOVEL CORONAVIRUS, NAA (HOSP ORDER, SEND-OUT TO REF LAB; TAT 18-24 HRS): SARS-CoV-2, NAA: NOT DETECTED

## 2019-12-27 MED ORDER — ALBUTEROL SULFATE HFA 108 (90 BASE) MCG/ACT IN AERS: 1.0000 | INHALATION_SPRAY | Freq: Four times a day (QID) | RESPIRATORY_TRACT | 0 refills | Status: DC | PRN

## 2019-12-27 NOTE — Telephone Encounter (Signed)
Pt called stating her inhaler was not ready at the pharmacy, reordered, status states delivered.

## 2019-12-28 LAB — CERVICOVAGINAL ANCILLARY ONLY
Bacterial vaginitis: NEGATIVE
Candida vaginitis: NEGATIVE
Chlamydia: NEGATIVE
Neisseria Gonorrhea: NEGATIVE
Trichomonas: NEGATIVE

## 2020-03-18 ENCOUNTER — Other Ambulatory Visit: Payer: Self-pay

## 2020-03-18 DIAGNOSIS — Z79899 Other long term (current) drug therapy: Secondary | ICD-10-CM | POA: Diagnosis not present

## 2020-03-18 DIAGNOSIS — L299 Pruritus, unspecified: Secondary | ICD-10-CM | POA: Diagnosis present

## 2020-03-18 DIAGNOSIS — B850 Pediculosis due to Pediculus humanus capitis: Secondary | ICD-10-CM | POA: Diagnosis not present

## 2020-03-19 ENCOUNTER — Emergency Department (HOSPITAL_COMMUNITY)
Admission: EM | Admit: 2020-03-19 | Discharge: 2020-03-19 | Disposition: A | Discharge status: Home / Self Care | Payer: BC Managed Care – PPO | Attending: Emergency Medicine | Admitting: Emergency Medicine

## 2020-03-19 ENCOUNTER — Encounter (HOSPITAL_COMMUNITY): Payer: Self-pay

## 2020-03-19 DIAGNOSIS — B850 Pediculosis due to Pediculus humanus capitis: Secondary | ICD-10-CM

## 2020-03-19 MED ORDER — PERMETHRIN 5 % EX CREA: TOPICAL_CREAM | CUTANEOUS | 0 refills | Status: DC

## 2020-03-19 NOTE — ED Provider Notes (Signed)
[facility]-EMERGENCY DEPT Provider Note   CSN: [id number] Arrival date & time: 03/18/20  2343     History Chief Complaint  Patient presents with  . Rash    Denise Mann is a 24 y.o. female.  Patient is a 24 year old female with no significant past medical history.  She presents with complaints of itchy scalp and rash to her neck.  Patient states that her sister saw lice in her hair.  She has tried an over-the-counter remedy, however this has not worked.  The history is provided by the patient.  Rash Location:  Head/neck Quality: itchiness   Severity:  Moderate Onset quality:  Gradual Duration:  1 week Timing:  Constant Progression:  Worsening Chronicity:  New Relieved by:  Nothing Worsened by:  Nothing      Past Medical History:  Diagnosis Date  . Abdominal pain, recurrent   . Weight loss     Patient Active Problem List   Diagnosis Date Noted  . 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 Nausea & vomiting 01/29/2012  . Poor appetite 01/29/2012  . Lower abdominal pain   . Weight loss     History reviewed. No pertinent surgical history.   OB History   No obstetric history on file.     Family History  Problem Relation Age of Onset  . Hypertension Mother   . Diabetes Mother   . Asthma Mother   . Healthy Father     Social History   Tobacco Use  . Smoking status: Never Smoker  . Smokeless tobacco: Never Used  Substance Use Topics  . Alcohol use: Never  . Drug use: Never    Home Medications Prior to Admission medications   Medication Sig Start Date End Date Taking? Authorizing Provider  ACYCLOVIR PO Take by mouth.    [provider]  albuterol (VENTOLIN HFA) 108 (90 Base) MCG/ACT inhaler Inhale 1-2 puffs into the lungs every 6 (six) hours as needed for wheezing or shortness of breath. 12/27/19   Wallis Bamberg, PA-C  benzonatate (TESSALON) 100 MG capsule Take 1-2 capsules  (100-200 mg total) by mouth 3 (three) times daily as needed. 12/26/19   Wallis Bamberg, PA-C  naproxen (NAPROSYN) 500 MG tablet Take 1 tablet (500 mg total) by mouth 2 (two) times daily. 09/04/19   Wallis Bamberg, PA-C  Norethindrone-Ethinyl Estradiol-Fe Biphas (LO LOESTRIN FE) 1 MG-10 MCG / 10 MCG tablet Take 1 tablet by mouth daily.     [provider]  predniSONE (DELTASONE) 20 MG tablet Take 2 tablets daily with breakfast. 12/26/19   Wallis Bamberg, PA-C  promethazine-dextromethorphan (PROMETHAZINE-DM) 6.25-15 MG/5ML syrup Take 5 mLs by mouth at bedtime as needed for cough. 12/26/19   Wallis Bamberg, PA-C  Cetirizine HCl 10 MG CAPS Take 1 capsule (10 mg total) by mouth daily for 15 days. Patient not taking: Reported on 07/31/2019 04/16/18 09/04/19  Wieters, Hallie C, PA-C  fluticasone (FLONASE) 50 MCG/ACT nasal spray Place 1 spray into both nostrils daily for 7 days. Patient not taking: Reported on 07/31/2019 04/16/18 09/04/19  Wieters, Hallie C, PA-C  lactase (LACTAID) 3000 UNITS tablet Take 1 tablet (3,000 Units total) by mouth 3 (three) times daily with meals. Patient not taking: Reported on 07/31/2019 08/10/12 09/04/19  Jon Gills, MD  metoCLOPramide (REGLAN) 10 MG tablet Take 1 tablet (10 mg total) by mouth every 6 (six) hours. Patient not taking: Reported on 07/31/2019 04/28/16 09/04/19  Janne Napoleon, NP  Allergies    Sulfa antibiotics  Review of Systems   Review of Systems  Skin: Positive for rash.  All other systems reviewed and are negative.   Physical Exam Updated Vital Signs BP 113/79   Pulse 76   Temp 98.4 F (36.9 C) (Oral)   Resp 16   SpO2 97%   Physical Exam Vitals and nursing note reviewed.  Constitutional:      General: She is not in acute distress.    Appearance: Normal appearance. She is not ill-appearing, toxic-appearing or diaphoretic.  HENT:     Head: Normocephalic and atraumatic.     Comments: Scalp and skin to the back of the neck with some erythema.  I am unable to  see any definite mites. Pulmonary:     Effort: Pulmonary effort is normal.  Skin:    General: Skin is warm and dry.  Neurological:     Mental Status: She is alert.     ED Results / Procedures / Treatments   Labs (all labs ordered are listed, but only abnormal results are displayed) Labs Reviewed - No data to display  EKG None  Radiology No results found.  Procedures Procedures (including critical care time)  Medications Ordered in ED Medications - No data to display  ED Course  I have reviewed the triage vital signs and the nursing notes.  Pertinent labs & imaging results that were available during my care of the patient were reviewed by me and considered in my medical decision making (see chart for details).    MDM Rules/Calculators/A&P  Will treat for head lice.  Follow up prn.  Final Clinical Impression(s) / ED Diagnoses Final diagnoses:  None    Rx / DC Orders ED Discharge Orders    None       Veryl Speak, MD 03/19/20 [id number]

## 2020-03-19 NOTE — ED Notes (Signed)
Pt up walking around the room. Denies any needs. Ready for d/c

## 2020-03-19 NOTE — ED Triage Notes (Signed)
Pt reports a generalized rash on her back and states that she thinks that she has lice. She states that her sister helped her do the OTC shampoo treatment earlier today, but she's worried that it didn't get them all.Marland Kitchen

## 2020-03-19 NOTE — Discharge Instructions (Signed)
Apply permethrin cream as per package instructions.

## 2020-08-20 ENCOUNTER — Other Ambulatory Visit: Payer: Self-pay

## 2020-08-20 ENCOUNTER — Emergency Department (HOSPITAL_COMMUNITY)
Admission: EM | Admit: 2020-08-20 | Discharge: 2020-08-20 | Disposition: A | Discharge status: Home / Self Care | Payer: BC Managed Care – PPO | Attending: Emergency Medicine | Admitting: Emergency Medicine

## 2020-08-20 ENCOUNTER — Emergency Department (HOSPITAL_COMMUNITY): Payer: BC Managed Care – PPO

## 2020-08-20 DIAGNOSIS — Z79899 Other long term (current) drug therapy: Secondary | ICD-10-CM | POA: Insufficient documentation

## 2020-08-20 DIAGNOSIS — R519 Headache, unspecified: Secondary | ICD-10-CM | POA: Diagnosis present

## 2020-08-20 DIAGNOSIS — U071 COVID-19: Secondary | ICD-10-CM

## 2020-08-20 LAB — SARS CORONAVIRUS 2 BY RT PCR (HOSPITAL ORDER, PERFORMED IN ~~LOC~~ HOSPITAL LAB): SARS Coronavirus 2: POSITIVE — AB

## 2020-08-20 NOTE — ED Provider Notes (Signed)
[facility]-EMERGENCY DEPT Provider Note   CSN: [id number] Arrival date & time: 08/20/20  1251     History Chief Complaint  Patient presents with  . loss of taste & smell  . Generalized Body Aches  . Headache    Denise Mann is a 24 y.o. female.  HPI    Patient status post Covid vaccine now presents with 3 days of fever, loss of taste and smell, myalgia. Patient denies obvious sick exposure, but works in [employer].  During this illness she has had no relief with anything.  She denies confusion, disorientation, focal pain, dyspnea.  Past Medical History:  Diagnosis Date  . Abdominal pain, recurrent   . Weight loss     Patient Active Problem List   Diagnosis Date Noted  . 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 Nausea & vomiting 01/29/2012  . Poor appetite 01/29/2012  . Lower abdominal pain   . Weight loss     No past surgical history on file.   OB History   No obstetric history on file.     Family History  Problem Relation Age of Onset  . Hypertension Mother   . Diabetes Mother   . Asthma Mother   . Healthy Father     Social History   Tobacco Use  . Smoking status: Never Smoker  . Smokeless tobacco: Never Used  Vaping Use  . Vaping Use: Never used  Substance Use Topics  . Alcohol use: Never  . Drug use: Never    Home Medications Prior to Admission medications   Medication Sig Start Date End Date Taking? Authorizing Provider  ACYCLOVIR PO Take by mouth.    [provider]  albuterol (VENTOLIN HFA) 108 (90 Base) MCG/ACT inhaler Inhale 1-2 puffs into the lungs every 6 (six) hours as needed for wheezing or shortness of breath. 12/27/19   Wallis Bamberg, PA-C  benzonatate (TESSALON) 100 MG capsule Take 1-2 capsules (100-200 mg total) by mouth 3 (three) times daily as needed. 12/26/19   Wallis Bamberg, PA-C  naproxen (NAPROSYN) 500 MG tablet Take 1 tablet (500 mg total) by mouth 2  (two) times daily. 09/04/19   Wallis Bamberg, PA-C  Norethindrone-Ethinyl Estradiol-Fe Biphas (LO LOESTRIN FE) 1 MG-10 MCG / 10 MCG tablet Take 1 tablet by mouth daily.     [provider]  permethrin (ELIMITE) 5 % cream Apply to affected area once 03/19/20   Geoffery Lyons, MD  predniSONE (DELTASONE) 20 MG tablet Take 2 tablets daily with breakfast. 12/26/19   Wallis Bamberg, PA-C  promethazine-dextromethorphan (PROMETHAZINE-DM) 6.25-15 MG/5ML syrup Take 5 mLs by mouth at bedtime as needed for cough. 12/26/19   Wallis Bamberg, PA-C  Cetirizine HCl 10 MG CAPS Take 1 capsule (10 mg total) by mouth daily for 15 days. Patient not taking: Reported on 07/31/2019 04/16/18 09/04/19  Wieters, Hallie C, PA-C  fluticasone (FLONASE) 50 MCG/ACT nasal spray Place 1 spray into both nostrils daily for 7 days. Patient not taking: Reported on 07/31/2019 04/16/18 09/04/19  Wieters, Hallie C, PA-C  lactase (LACTAID) 3000 UNITS tablet Take 1 tablet (3,000 Units total) by mouth 3 (three) times daily with meals. Patient not taking: Reported on 07/31/2019 08/10/12 09/04/19  Jon Gills, MD  metoCLOPramide (REGLAN) 10 MG tablet Take 1 tablet (10 mg total) by mouth every 6 (six) hours. Patient not taking: Reported on 07/31/2019 04/28/16 09/04/19  Janne Napoleon, NP    Allergies    Sulfa antibiotics  Review of Systems   Review of Systems  Constitutional:       Per HPI, otherwise negative  HENT:       Per HPI, otherwise negative  Respiratory:       Per HPI, otherwise negative  Cardiovascular:       Per HPI, otherwise negative  Gastrointestinal: Positive for nausea. Negative for vomiting.  Endocrine:       Negative aside from HPI  Genitourinary:       Neg aside from HPI   Musculoskeletal:       Per HPI, otherwise negative  Skin: Negative.   Neurological: Positive for weakness. Negative for syncope.    Physical Exam Updated Vital Signs BP 110/78 (BP Location: Left Arm)   Pulse 93   Temp 99 F (37.2 C) (Oral)   Resp 18    Ht 4\' 11"  (1.499 m)   Wt 47.6 kg   LMP 07/30/2020   SpO2 96%   BMI 21.21 kg/m   Physical Exam Vitals and nursing note reviewed.  Constitutional:      General: She is not in acute distress.    Appearance: She is well-developed.  HENT:     Head: Normocephalic and atraumatic.  Eyes:     Conjunctiva/sclera: Conjunctivae normal.  Cardiovascular:     Rate and Rhythm: Normal rate and regular rhythm.  Pulmonary:     Effort: Pulmonary effort is normal. No respiratory distress.     Breath sounds: Normal breath sounds. No stridor.  Abdominal:     General: There is no distension.  Skin:    General: Skin is warm and dry.  Neurological:     Mental Status: She is alert and oriented to person, place, and time.     Cranial Nerves: No cranial nerve deficit.     ED Results / Procedures / Treatments   Labs (all labs ordered are listed, but only abnormal results are displayed) Labs Reviewed  SARS CORONAVIRUS 2 BY RT PCR (HOSPITAL ORDER, PERFORMED IN [facility]) - Abnormal; Notable for the following components:      Result Value   SARS Coronavirus 2 POSITIVE (*)    All other components within normal limits    EKG None  Radiology DG Chest Port 1 View  Result Date: 08/20/2020 CLINICAL DATA:  Pt c/o headache, body aches, loss of taste and smell, and chills for several days. EXAM: PORTABLE CHEST 1 VIEW COMPARISON:  Chest radiograph 04/16/2018 FINDINGS: The cardiomediastinal contours are within normal limits. The lungs are clear. No pneumothorax or pleural effusion. No acute finding in the visualized skeleton. IMPRESSION: No acute cardiopulmonary process. Electronically Signed   By: 04/18/2018 M.D.   On: 08/20/2020 16:33    Procedures Procedures (including critical care time)  Medications Ordered in ED Medications - No data to display  ED Course  I have reviewed the triage vital signs and the nursing notes.  Pertinent labs & imaging results that were available  during my care of the patient were reviewed by me and considered in my medical decision making (see chart for details).    MDM Rules/Calculators/A&P                          5:55 PM Patient awake and alert.  We discussed her coronavirus test result, positive. We discussed home care, discharge, return precautions, patient discharged in stable condition.  Denise Mann was evaluated in Emergency Department on 08/20/2020 for the symptoms  described in the history of present illness. She was evaluated in the context of the global COVID-19 pandemic, which necessitated consideration that the patient might be at risk for infection with the SARS-CoV-2 virus that causes COVID-19. Institutional protocols and algorithms that pertain to the evaluation of patients at risk for COVID-19 are in a state of rapid change based on information released by regulatory bodies including the CDC and federal and state organizations. These policies and algorithms were followed during the patient's care in the ED.  Final Clinical Impression(s) / ED Diagnoses Final diagnoses:  COVID-19      Gerhard Munch, MD 08/20/20 1755

## 2020-08-20 NOTE — Discharge Instructions (Signed)
As discussed, you have been diagnosed with COVID.  Monitor your condition and do not hesitate to return here for for any concerning changes in your condition.  If you are a candidate for additional therapy, such as infusions, our staff will contact you.

## 2020-08-20 NOTE — ED Triage Notes (Signed)
Pt c/o headache, body aches, loss of taste and smell, chills for several days. Unsure if exposed to Covid, works in [employer].

## 2020-08-20 NOTE — ED Notes (Signed)
An After Visit Summary was printed and given to the patient. Discharge instructions given and no further questions at this time.  

## 2020-08-21 ENCOUNTER — Telehealth: Payer: Self-pay | Admitting: Adult Health

## 2020-08-21 NOTE — Telephone Encounter (Signed)
Called and discussed monoclonal antibody treatment for COVID 19 given to those who are at risk for complications and/or hospitalization of the virus.  Patient meets criteria based on: her h/o asthma.  She would like to discuss this with her mom and will call us back.  I will send her more details through my chart so she and her mom can review.    Call back number given: [phone]  My chart message: sent  Lillard Anes, NP

## 2021-03-17 ENCOUNTER — Other Ambulatory Visit: Payer: Self-pay

## 2021-03-17 ENCOUNTER — Emergency Department (HOSPITAL_BASED_OUTPATIENT_CLINIC_OR_DEPARTMENT_OTHER)
Admission: EM | Admit: 2021-03-17 | Discharge: 2021-03-17 | Disposition: A | Discharge status: Home / Self Care | Payer: BC Managed Care – PPO | Attending: Emergency Medicine | Admitting: Emergency Medicine

## 2021-03-17 ENCOUNTER — Encounter (HOSPITAL_BASED_OUTPATIENT_CLINIC_OR_DEPARTMENT_OTHER): Payer: Self-pay | Admitting: *Deleted

## 2021-03-17 DIAGNOSIS — W01198A Fall on same level from slipping, tripping and stumbling with subsequent striking against other object, initial encounter: Secondary | ICD-10-CM | POA: Diagnosis not present

## 2021-03-17 DIAGNOSIS — W010XXA Fall on same level from slipping, tripping and stumbling without subsequent striking against object, initial encounter: Secondary | ICD-10-CM

## 2021-03-17 DIAGNOSIS — S0003XA Contusion of scalp, initial encounter: Secondary | ICD-10-CM | POA: Insufficient documentation

## 2021-03-17 DIAGNOSIS — S60221A Contusion of right hand, initial encounter: Secondary | ICD-10-CM | POA: Insufficient documentation

## 2021-03-17 DIAGNOSIS — Y92000 Kitchen of unspecified non-institutional (private) residence as  the place of occurrence of the external cause: Secondary | ICD-10-CM | POA: Diagnosis not present

## 2021-03-17 DIAGNOSIS — S0990XA Unspecified injury of head, initial encounter: Secondary | ICD-10-CM

## 2021-03-17 DIAGNOSIS — S6991XA Unspecified injury of right wrist, hand and finger(s), initial encounter: Secondary | ICD-10-CM | POA: Diagnosis present

## 2021-03-17 DIAGNOSIS — Y93G3 Activity, cooking and baking: Secondary | ICD-10-CM | POA: Insufficient documentation

## 2021-03-17 MED ORDER — ONDANSETRON 4 MG PO TBDP
8.0000 mg | ORAL_TABLET | Freq: Once | ORAL | Status: AC
  Administered 2021-03-17: 8 mg via ORAL
  Filled 2021-03-17: qty 2

## 2021-03-17 MED ORDER — MECLIZINE HCL 25 MG PO TABS
25.0000 mg | ORAL_TABLET | Freq: Once | ORAL | Status: AC
  Administered 2021-03-17: 25 mg via ORAL
  Filled 2021-03-17: qty 1

## 2021-03-17 MED ORDER — MECLIZINE HCL 25 MG PO TABS: 25.0000 mg | ORAL_TABLET | Freq: Three times a day (TID) | ORAL | 0 refills | Status: DC | PRN

## 2021-03-17 MED ORDER — ONDANSETRON 8 MG PO TBDP: 8.0000 mg | ORAL_TABLET | Freq: Three times a day (TID) | ORAL | 0 refills | Status: DC | PRN

## 2021-03-17 NOTE — ED Provider Notes (Signed)
[facility] Provider Note: Lowella Dell, MD, FACEP  CSN: [id number] MRN: [id number] ARRIVAL: 03/17/21 at 2153 ROOM: MH09/MH09   CHIEF COMPLAINT  Head Injury   HISTORY OF PRESENT ILLNESS  03/17/21 11:01 PM Denise Mann is a 25 y.o. female who slipped on oil and fell in her kitchen yesterday evening about 830 while cooking.  She tried to catch herself with her outstretched right hand but was not fully successful and ended up striking her right occipitoparietal region on the floor.  She did not lose consciousness.  She has not vomited.  Today she did experience some nausea some phonophobia and some dizziness by which she means a sensation of feeling off balance.  The symptoms have not been severe.  She rates the pain at the side of her head injury as a 6 out of 10, worse with palpation.   Past Medical History:  Diagnosis Date  . Abdominal pain, recurrent   . Weight loss     History reviewed. No pertinent surgical history.  Family History  Problem Relation Age of Onset  . Hypertension Mother   . Diabetes Mother   . Asthma Mother   . Healthy Father     Social History   Tobacco Use  . Smoking status: Never Smoker  . Smokeless tobacco: Never Used  Vaping Use  . Vaping Use: Every day  Substance Use Topics  . Alcohol use: Never  . Drug use: Never    Prior to Admission medications   Medication Sig Start Date End Date Taking? Authorizing Provider  meclizine (ANTIVERT) 25 MG tablet Take 1 tablet (25 mg total) by mouth 3 (three) times daily as needed for dizziness (may cause drowsiness). 03/17/21  Yes Donnel Venuto, Jonny Ruiz, MD  ondansetron (ZOFRAN ODT) 8 MG disintegrating tablet Take 1 tablet (8 mg total) by mouth every 8 (eight) hours as needed for nausea or vomiting. 03/17/21  Yes Nohlan Burdin, MD  ACYCLOVIR PO Take by mouth.    [provider]  albuterol (VENTOLIN HFA) 108 (90 Base) MCG/ACT inhaler Inhale 1-2 puffs into the lungs every 6 (six) hours as  needed for wheezing or shortness of breath. 12/27/19   Wallis Bamberg, PA-C  Norethindrone-Ethinyl Estradiol-Fe Biphas (LO LOESTRIN FE) 1 MG-10 MCG / 10 MCG tablet Take 1 tablet by mouth daily.     [provider]  Cetirizine HCl 10 MG CAPS Take 1 capsule (10 mg total) by mouth daily for 15 days. Patient not taking: Reported on 07/31/2019 04/16/18 09/04/19  Wieters, Hallie C, PA-C  fluticasone (FLONASE) 50 MCG/ACT nasal spray Place 1 spray into both nostrils daily for 7 days. Patient not taking: Reported on 07/31/2019 04/16/18 09/04/19  Wieters, Hallie C, PA-C  lactase (LACTAID) 3000 UNITS tablet Take 1 tablet (3,000 Units total) by mouth 3 (three) times daily with meals. Patient not taking: Reported on 07/31/2019 08/10/12 09/04/19  Jon Gills, MD  metoCLOPramide (REGLAN) 10 MG tablet Take 1 tablet (10 mg total) by mouth every 6 (six) hours. Patient not taking: Reported on 07/31/2019 04/28/16 09/04/19  Janne Napoleon, NP    Allergies Sulfa antibiotics   REVIEW OF SYSTEMS  Negative except as noted here or in the History of Present Illness.   PHYSICAL EXAMINATION  Initial Vital Signs Blood pressure 107/67, pulse 83, temperature 98.8 F (37.1 C), temperature source Oral, resp. rate 16, height 4' 11.5" (1.511 m), weight 59 kg, last menstrual period 03/10/2021, SpO2 99 %.  Examination General: Well-developed, well-nourished female in no  acute distress; appearance consistent with age of record HENT: normocephalic; superficial hematoma of right occipitoparietal scalp; no hemotympanum Eyes: pupils equal, round and reactive to light; extraocular muscles intact Neck: supple; nontender Heart: regular rate and rhythm Lungs: clear to auscultation bilaterally Abdomen: soft; nondistended; nontender; bowel sounds present Extremities: No deformity; full range of motion; pulses normal; mild tenderness of right thenar eminence Neurologic: Awake, alert and oriented; motor function intact in all extremities and  symmetric; no facial droop Skin: Warm and dry Psychiatric: Normal mood and affect   RESULTS  Summary of this visit's results, reviewed and interpreted by myself:   EKG Interpretation  Date/Time:    Ventricular Rate:    PR Interval:    QRS Duration:   QT Interval:    QTC Calculation:   R Axis:     Text Interpretation:        Laboratory Studies: No results found for this or any previous visit (from the past 24 hour(s)). Imaging Studies: No results found.  ED COURSE and MDM  Nursing notes, initial and subsequent vitals signs, including pulse oximetry, reviewed and interpreted by myself.  Vitals:   03/17/21 2208 03/17/21 2211  BP: 107/67   Pulse: 83   Resp: 16   Temp: 98.8 F (37.1 C)   TempSrc: Oral   SpO2: 99%   Weight:  59 kg  Height:  4' 11.5" (1.511 m)   Medications  ondansetron (ZOFRAN-ODT) disintegrating tablet 8 mg (has no administration in time range)  meclizine (ANTIVERT) tablet 25 mg (has no administration in time range)    There are no red flags to make me think this patient has an intracranial hemorrhage.  She has not been vomiting, she has a normal level of consciousness.  Her symptoms are within the spectrum of postconcussive syndrome although she may also have jarred her internal ear causing her vertigo/hearing complaints.  PROCEDURES  Procedures   ED DIAGNOSES     ICD-10-CM   1. Minor head injury, initial encounter  S09.90XA   2. Fall from slipping on oil, initial encounter  W01.0XXA   3. Contusion of right hand, initial encounter  S60.221A   4. Contusion of occipital region of scalp, initial encounter  S00.Marchia Bond, MD 03/17/21 [phone]

## 2021-03-17 NOTE — ED Triage Notes (Signed)
Pt reports she slipped on oil in her kitchen and fell hitting her head on ceramic tile floor. No LOC. Reports head hurts more today. She has not taken any medications

## 2021-05-29 ENCOUNTER — Encounter: Payer: Self-pay | Admitting: Internal Medicine

## 2021-05-29 ENCOUNTER — Ambulatory Visit: Payer: BC Managed Care – PPO | Admitting: Internal Medicine

## 2021-05-29 VITALS — BP 102/69 | HR 72 | Temp 98.3°F | Wt 136.4 lb

## 2021-05-29 DIAGNOSIS — I959 Hypotension, unspecified: Secondary | ICD-10-CM | POA: Diagnosis not present

## 2021-05-29 DIAGNOSIS — F320 Major depressive disorder, single episode, mild: Secondary | ICD-10-CM

## 2021-05-29 DIAGNOSIS — R519 Headache, unspecified: Secondary | ICD-10-CM | POA: Diagnosis not present

## 2021-05-29 DIAGNOSIS — G8929 Other chronic pain: Secondary | ICD-10-CM

## 2021-05-29 DIAGNOSIS — F32A Depression, unspecified: Secondary | ICD-10-CM

## 2021-05-29 NOTE — Progress Notes (Signed)
   CC: Establish Care   HPI:  [name] is a 25 y.o. M/F, with a PMH noted below, who presents to the clinic to establish care and follow up with Dr. Monna Fam. To see the management of their acute and chronic conditions, please see the A&P note under the Encounters tab.   Medications: Estrogen oral   PMH:  None  PSH:  None  FH:  HTN: Mother DM: Grandfather (Mother's side)  Cancer:  Father (Lungs)  Stroke: None  SH:  Accommodations: Apartment Work: [employer] Tobacco: None ETOH: None  Sexual Practice: heterosexual,  Condom and estrogen pill, men  Periods: 4 days, once a month, mostly spotting.   OBGYN at n elong  Last PAP smear normal April 2022  Past Medical History:  Diagnosis Date  . Abdominal pain, recurrent   . Weight loss    Review of Systems:   Review of Systems  Constitutional: Negative for chills, fever and malaise/fatigue.  Eyes: Negative for double vision, photophobia, pain and discharge.  Cardiovascular: Negative for chest pain and palpitations.  Gastrointestinal: Negative for abdominal pain, constipation, diarrhea, nausea and vomiting.  Musculoskeletal: Negative for joint pain.  Neurological: Negative for dizziness and headaches.     Physical Exam:  Vitals:   05/29/21 1505  BP: (!) 84/52  Pulse: 91  Temp: 98.3 F (36.8 C)  TempSrc: Oral  SpO2: 98%  Weight: 136 lb 6.4 oz (61.9 kg)   Physical Exam Constitutional:      General: She is not in acute distress.    Appearance: Normal appearance. She is not ill-appearing or toxic-appearing.  HENT:     Head: Normocephalic.  Cardiovascular:     Rate and Rhythm: Normal rate and regular rhythm.     Pulses: Normal pulses.     Heart sounds: Normal heart sounds. No murmur heard. No friction rub. No gallop.   Pulmonary:     Effort: Pulmonary effort is normal.     Breath sounds: Normal breath sounds. No wheezing, rhonchi or rales.  Abdominal:     General:  Abdomen is flat. Bowel sounds are normal.  Musculoskeletal:     Right lower leg: No edema.     Left lower leg: No edema.  Skin:    Findings: No bruising, erythema or rash.  Neurological:     Mental Status: She is alert and oriented to person, place, and time.      Assessment & Plan:   See Encounters Tab for problem based charting.   Patient discussed with Dr. Antony Contras

## 2021-05-29 NOTE — Patient Instructions (Addendum)
To Ms. Denise Mann,   It was a pleasure meeting you today! Today we talked about your headaches, blood pressure, and continued follow up with Dr. Monna Fam.   For your headaches, we will get some establishing blood work to rule out causes of headaches including electrolyte abnormalities, or kidney function. Dehydration is a common cause of headaches, I would recommend increase fluid intake.   Your blood pressure is low today. Your orthostatic vital signs (blood pressure checked at lying down, sitting, and standing) We will have you come back in 1 week for a blood pressure check. I would recommend trying to stay hydrated and eating regularly scheduled meals. We will see you in a week! Dolan Amen, MD

## 2021-05-30 ENCOUNTER — Encounter: Payer: Self-pay | Admitting: Internal Medicine

## 2021-05-30 DIAGNOSIS — G8929 Other chronic pain: Secondary | ICD-10-CM | POA: Insufficient documentation

## 2021-05-30 DIAGNOSIS — F32A Depression, unspecified: Secondary | ICD-10-CM | POA: Insufficient documentation

## 2021-05-30 DIAGNOSIS — I959 Hypotension, unspecified: Secondary | ICD-10-CM | POA: Insufficient documentation

## 2021-05-30 LAB — CMP14 + ANION GAP
ALT: 13 IU/L (ref 0–32)
AST: 20 IU/L (ref 0–40)
Albumin/Globulin Ratio: 1.4 (ref 1.2–2.2)
Albumin: 4.2 g/dL (ref 3.9–5.0)
Alkaline Phosphatase: 74 IU/L (ref 44–121)
Anion Gap: 17 mmol/L (ref 10.0–18.0)
BUN/Creatinine Ratio: 16 (ref 9–23)
BUN: 9 mg/dL (ref 6–20)
Bilirubin Total: 0.3 mg/dL (ref 0.0–1.2)
CO2: 21 mmol/L (ref 20–29)
Calcium: 9.4 mg/dL (ref 8.7–10.2)
Chloride: 100 mmol/L (ref 96–106)
Creatinine, Ser: 0.55 mg/dL — ABNORMAL LOW (ref 0.57–1.00)
Globulin, Total: 3 g/dL (ref 1.5–4.5)
Glucose: 87 mg/dL (ref 65–99)
Potassium: 4.2 mmol/L (ref 3.5–5.2)
Sodium: 138 mmol/L (ref 134–144)
Total Protein: 7.2 g/dL (ref 6.0–8.5)
eGFR: 132 mL/min/{1.73_m2} (ref >59)

## 2021-05-30 LAB — CBC WITH DIFFERENTIAL/PLATELET
Basophils Absolute: 0 10*3/uL (ref 0.0–0.2)
Basos: 0 %
EOS (ABSOLUTE): 0.4 10*3/uL (ref 0.0–0.4)
Eos: 5 %
Hematocrit: 41.6 % (ref 34.0–46.6)
Hemoglobin: 13.8 g/dL (ref 11.1–15.9)
Immature Grans (Abs): 0 10*3/uL (ref 0.0–0.1)
Immature Granulocytes: 0 %
Lymphocytes Absolute: 2.8 10*3/uL (ref 0.7–3.1)
Lymphs: 39 %
MCH: 30 pg (ref 26.6–33.0)
MCHC: 33.2 g/dL (ref 31.5–35.7)
MCV: 90 fL (ref 79–97)
Monocytes Absolute: 0.5 10*3/uL (ref 0.1–0.9)
Monocytes: 8 %
Neutrophils Absolute: 3.5 10*3/uL (ref 1.4–7.0)
Neutrophils: 48 %
Platelets: 242 10*3/uL (ref 150–450)
RBC: 4.6 x10E6/uL (ref 3.77–5.28)
RDW: 12.2 % (ref 11.7–15.4)
WBC: 7.2 10*3/uL (ref 3.4–10.8)

## 2021-05-30 NOTE — Assessment & Plan Note (Addendum)
Patient presents to the clinic for establishment of care. Upon assessment of her vitals she was found to have a BP of 84/52 with a pulse of 91. She stated that she is asymptomatic. She does not take any medications for blood pressure control. She admits that she has not had anything to eat or drink since yesterday evening due to  Working the night shift. Her orthostatic vitals are as follows:  Vitals with BMI 05/29/2021 05/29/2021 05/29/2021  Height - - -  Weight - - -  BMI - - -  Systolic 102 98 90  Diastolic 69 59 60  Pulse 72 72 69   And patient was asymptomatic during the examination. She denies diarrhea, nausea, vomiting.  A/P:  Patient with new, asymptomatic hypotension. Her orthostatics are wnl, and she was asymptomatic. Her symptoms are likely in the setting of decreased PO intake, but should rule out adrenal insufficiency. Will order a CMP to assess for metabolic derangements. Encouraged to replete with PO fluids, will reassess in a week for a BP check.  - CMP  - PO fluid intake - BP Check in 1 week

## 2021-05-30 NOTE — Assessment & Plan Note (Addendum)
PHQ-9 score today at 7, she is a patient who is establishing care as a prior Dr. Monna Fam patient. She followed with Dr. Monna Fam since she was around 25 years old, and has been dealing with the loss of her father this past January. She states that working with Dr. Monna Fam has been very helpful, and would like to continue to be seen.  - [facility] referral sent to re-establish care.

## 2021-05-30 NOTE — Assessment & Plan Note (Signed)
Patient presents with Hx of chronic headaches that have increased in frequency over the past month. She states that these headaches mostly occur during the day, and are associated with some dizziness, nausea, and photophobia. These headaches usually last hours, and only subside if she is able to get back to sleep, but are also slightly relieved with a dark and quiet room. When she does wake up she will sometimes feel like she has a mental "fog." She states that her symptoms have been occurring over some years. She was last seen for these headaches in March 2022 and was given medications for vertigo.   Over the past month she has had three more episodes that she attributes to increased stress. 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. She has also switched to the night shift that has made it difficult for her to sleep.   She states that she does have a FHx of headaches with her mother who had similar episodes when she was younger, which have subsided as she has grown older.   A/P:  Patient presents with increase in episodes of her chronic headaches. These episodes have increased in the setting of increased stress and sleep disturbance. She denies having visual auras or vision changes. She additionally states that her mother has a Hx of migraines with similar episodes when she was younger. Given that she does have some light headedness with hypotension on arrival she may be volume depleted. Her orthostatics are within normal limits. Will continue to monitor at this time. Given the presentation it sounds more consistent with migraines. Will rule out causes of syncope first.  - CMP  - Increase PO fluid intake - Reassess for frequency and quality of headaches.

## 2021-06-18 ENCOUNTER — Institutional Professional Consult (permissible substitution): Payer: BC Managed Care – PPO | Admitting: Behavioral Health

## 2021-06-26 ENCOUNTER — Encounter: Payer: Self-pay | Admitting: *Deleted

## 2021-07-04 ENCOUNTER — Ambulatory Visit: Payer: BC Managed Care – PPO | Admitting: Behavioral Health

## 2021-07-04 DIAGNOSIS — F4321 Adjustment disorder with depressed mood: Secondary | ICD-10-CM

## 2021-07-04 DIAGNOSIS — F419 Anxiety disorder, unspecified: Secondary | ICD-10-CM

## 2021-07-04 DIAGNOSIS — F331 Major depressive disorder, recurrent, moderate: Secondary | ICD-10-CM

## 2021-07-04 DIAGNOSIS — G8929 Other chronic pain: Secondary | ICD-10-CM

## 2021-07-04 DIAGNOSIS — R519 Headache, unspecified: Secondary | ICD-10-CM

## 2021-07-04 NOTE — BH Specialist Note (Signed)
Integrated Behavioral Health Initial In-Person Visit  MRN: [id number] Name: Denise Mann  Number of Integrated Behavioral Health Clinician visits:: 1/6 Session Start time: 1:30pm  Session End time: 2:00pm Total time: 30 minutes  Types of Service: Individual psychotherapy  Interpretor:No. Interpretor Name and Language: n/a    Warm Hand Off Completed.         Subjective: Denise Mann is a 25 y.o. female accompanied by  self Patient was referred by self for mental health wellness support. Patient reports the following symptoms/concerns: elevated anx due to new job Duration of problem: months; Severity of problem: mild  Objective: Mood: Anxious and Depressed and Affect: Appropriate Risk of harm to self or others: No plan to harm self or others  Life Context: Family and Social: Pt is as social as she is able w/others School/Work: Pt is not employed or working currently. Self-Care: Pt is reading her Devotional, resting as she deems appropriate, making her medical appt & her mental health coping  Life Changes: Pt is facing inc'd frustration due to SDOH  Patient and/or Family's Strengths/Protective Factors: Social and Emotional competence, Concrete supports in place (healthy food, safe environments, etc.), and Physical Health (exercise, healthy diet, medication compliance, etc.)  Goals Addressed: Patient will: Reduce symptoms of: anxiety, depression, and stress Increase knowledge and/or ability of: coping skills, self-management skills, and stress reduction  Demonstrate ability to: Increase healthy adjustment to current life circumstances  Progress towards Goals: Ongoing  Interventions: Interventions utilized: Solution-Focused Strategies and Supportive Counseling  Standardized Assessments completed:  screeners prn  Patient and/or Family Response: Pt receptive & in need of call today. She requests future appt.  Patient Centered Plan: Patient is on the  following Treatment Plan(s):  Next session we will devote our time to the grief Pt is holding for the death of her Father on Jan 20/2022.  Assessment: Patient currently experiencing grief rxn since the sudden & unexpected loss of her Father. Pt participated in his care, driving down to [place] to secure his passage to [place] where he stayed in her Apt until his hospitalization & death from cancer.    Patient may benefit from assistance processing her grief. Normalization of her exp & validation of her feelings.  Plan: Follow up with behavioral health clinician on : 2-3 wks, f:f for 60 min Behavioral recommendations: Journal or take notes on your phone btwn sessions to keep your thoughts recorded for our next session Referral(s): [facility] (In Clinic) "From scale of 1-10, how likely are you to follow plan?": 10  Deneise Lever, LMFT

## 2021-07-24 ENCOUNTER — Ambulatory Visit: Payer: BC Managed Care – PPO | Admitting: Behavioral Health

## 2021-07-24 DIAGNOSIS — F419 Anxiety disorder, unspecified: Secondary | ICD-10-CM

## 2021-07-24 DIAGNOSIS — F331 Major depressive disorder, recurrent, moderate: Secondary | ICD-10-CM

## 2021-07-24 DIAGNOSIS — Z7189 Other specified counseling: Secondary | ICD-10-CM

## 2021-07-24 NOTE — BH Specialist Note (Signed)
Integrated Behavioral Health via Telemedicine Visit  07/24/2021 Tritia Endo [id number]  Number of Integrated Behavioral Health visits: 2/6 Session Start time: 3:00pm  Session End time: 3:45pm Total time: 45   Referring Provider: Dr. Dolan Amen, MD/Dr. Fredia Sorrow, PhD, LMFT Patient/Family location: Pt is home in private today [facility] Provider location: [facility] Office All persons participating in visit: Pt & Clinician Types of Service: Individual psychotherapy  I connected with [place] and/or [place] Palacios[place]Kahn's  self  via  Telephone or Engineer, civil (consulting)  (Video is Surveyor, mining) and verified that I am speaking with the correct person using two identifiers. Discussed confidentiality:  2nd visit  I discussed the limitations of telemedicine and the availability of in person appointments.  Discussed there is a possibility of technology failure and discussed alternative modes of communication if that failure occurs.  I discussed that engaging in this telemedicine visit, they consent to the provision of behavioral healthcare and the services will be billed under their insurance.  Patient and/or legal guardian expressed understanding and consented to Telemedicine visit:  2nd visit  Presenting Concerns: Patient and/or family reports the following symptoms/concerns: elevated anx/dep due to recent death of Father Duration of problem: 7-8 mos; Severity of problem: moderate  Patient and/or Family's Strengths/Protective Factors: Social connections, Social and Emotional competence, Concrete supports in place (healthy food, safe environments, etc.), and Sense of purpose  Goals Addressed: Patient will:  Reduce symptoms of: anxiety, depression, and stress   Increase knowledge and/or ability of: coping skills and healthy habits   Demonstrate ability to: Increase healthy adjustment to current life circumstances  Progress towards  Goals: Ongoing  Interventions: Interventions utilized:  Mindfulness or Management consultant, Supportive Counseling, Preventative Services/Health Promotion, and grief rxn Standardized Assessments completed:  Pt is a 6 on a scale from 1-10 in the dep'd status  per Pt reporting  Patient and/or Family Response: Pt responsive to call today & requests future appt to process her grief.   Assessment: Patient currently experiencing elevated anxiety & sadness due to death of Father from Lung Cancer & encephalopathy. This Clinician has known Pt since she was 25yo, traversing difficult divorce situation w/her Parents & other life issues.  Pt is a twin & her Str is also known to this Clinician allowing for relational work to occur relevant to situation.   Patient may benefit from cont'd attn & normalization of her experience of grief; processing of the multiple losses of this Pt.  Plan: Follow up with behavioral health clinician on : 2-3 wks on telehealth for 60 min Behavioral recommendations: Write down all the feelings that occur for you & we can discuss next session. Use your [place] Mind to promote integration of heart & mind. Disbelief can be difficult, but you will get there when you are ready. Referral(s): Integrated [facility] (In Clinic)   I discussed the assessment and treatment plan with the patient and/or parent/guardian. They were provided an opportunity to ask questions and all were answered. They agreed with the plan and demonstrated an understanding of the instructions.   They were advised to call back or seek an in-person evaluation if the symptoms worsen or if the condition fails to improve as anticipated.  Deneise Lever, LMFT

## 2021-08-14 ENCOUNTER — Ambulatory Visit: Payer: BC Managed Care – PPO | Admitting: Behavioral Health

## 2021-09-30 ENCOUNTER — Other Ambulatory Visit: Payer: Self-pay

## 2021-09-30 ENCOUNTER — Emergency Department
Admission: EM | Admit: 2021-09-30 | Discharge: 2021-09-30 | Disposition: A | Discharge status: Home / Self Care | Payer: BC Managed Care – PPO | Source: Home / Self Care

## 2021-09-30 DIAGNOSIS — J020 Streptococcal pharyngitis: Secondary | ICD-10-CM | POA: Diagnosis not present

## 2021-09-30 LAB — POCT RAPID STREP A (OFFICE): Rapid Strep A Screen: POSITIVE — AB

## 2021-09-30 MED ORDER — AMOXICILLIN 875 MG PO TABS: 875.0000 mg | ORAL_TABLET | Freq: Two times a day (BID) | ORAL | 0 refills | Status: AC

## 2021-09-30 NOTE — ED Provider Notes (Signed)
[name]    CSN: [id number] Arrival date & time: 09/30/21  0803      History   Chief Complaint Chief Complaint  Patient presents with   Sore Throat    HPI Denise Mann is a 25 y.o. female.   HPI  Patient states that she has recurring sore throats.  She had strep a couple months ago.  She has a painful sore throat that started yesterday.  No cough or cold symptoms.  No nasal congestion.  No fever or chills.  No nausea or vomiting.  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 No pertinent surgical history.  OB History   No obstetric history on file.      Home Medications    Prior to Admission medications   Medication Sig Start Date End Date Taking? Authorizing Provider  amoxicillin (AMOXIL) 875 MG tablet Take 1 tablet (875 mg total) by mouth 2 (two) times daily for 10 days. 09/30/21 10/10/21 Yes Eustace Moore, MD  Norethindrone-Ethinyl Estradiol-Fe Biphas (LO LOESTRIN FE) 1 MG-10 MCG / 10 MCG tablet Take 1 tablet by mouth daily.     [provider]  Cetirizine HCl 10 MG CAPS Take 1 capsule (10 mg total) by mouth daily for 15 days. Patient not taking: Reported on 07/31/2019 04/16/18 09/04/19  Wieters, Hallie C, PA-C  fluticasone (FLONASE) 50 MCG/ACT nasal spray Place 1 spray into both nostrils daily for 7 days. Patient not taking: Reported on 07/31/2019 04/16/18 09/04/19  Wieters, Hallie C, PA-C  lactase (LACTAID) 3000 UNITS tablet Take 1 tablet (3,000 Units total) by mouth 3 (three) times daily with meals. Patient not taking: Reported on 07/31/2019 08/10/12 09/04/19  Jon Gills, MD  metoCLOPramide (REGLAN) 10 MG tablet Take 1 tablet (10 mg total) by mouth every 6 (six) hours. Patient not taking: Reported on 07/31/2019 04/28/16 09/04/19   Janne Napoleon, NP    Family History Family History  Problem Relation Age of Onset   Hypertension Mother    Diabetes Mother    Asthma Mother    Cancer Father     Social History Social History   Tobacco Use   Smoking status: Never   Smokeless tobacco: Never  Vaping Use   Vaping Use: Former  Substance Use Topics   Alcohol use: Never   Drug use: Never     Allergies   Sulfa antibiotics   Review of Systems Review of Systems See HPI  Physical Exam Triage Vital Signs ED Triage Vitals  Enc Vitals Group     BP 09/30/21 0825 114/81     Pulse Rate 09/30/21 0825 71     Resp 09/30/21 0825 16     Temp 09/30/21 0825 98.5 F (36.9 C)     Temp Source 09/30/21 0825 Oral     SpO2 09/30/21 0825 98 %     Weight 09/30/21 0817 130 lb (59 kg)     Height 09/30/21 0817 4' 11.5" (1.511 m)     Head Circumference --      Peak Flow --      Pain Score 09/30/21 0817 7     Pain Loc --      Pain Edu? --      Excl.  in GC? --    No data found.  Updated Vital Signs BP 114/81 (BP Location: Right Arm)   Pulse 71   Temp 98.5 F (36.9 C) (Oral)   Resp 16   Ht 4' 11.5" (1.511 m)   Wt 59 kg   LMP 09/29/2021 (Exact Date)   SpO2 98%   BMI 25.82 kg/m      Physical Exam Constitutional:      General: She is not in acute distress.    Appearance: She is well-developed.  HENT:     Head: Normocephalic and atraumatic.     Right Ear: Tympanic membrane and ear canal normal.     Left Ear: Tympanic membrane and ear canal normal.     Nose: No congestion.     Mouth/Throat:     Mouth: Mucous membranes are moist.     Pharynx: Uvula midline. Pharyngeal swelling and posterior oropharyngeal erythema present.     Tonsils: Tonsillar exudate present. No tonsillar abscesses. 2+ on the right. 2+ on the left.  Eyes:     Conjunctiva/sclera: Conjunctivae normal.     Pupils: Pupils are equal, round, and reactive to light.  Cardiovascular:     Rate and Rhythm: Normal rate and regular rhythm.     Heart  sounds: Normal heart sounds.  Pulmonary:     Effort: Pulmonary effort is normal. No respiratory distress.  Abdominal:     General: There is no distension.     Palpations: Abdomen is soft.  Musculoskeletal:        General: Normal range of motion.     Cervical back: Normal range of motion.  Lymphadenopathy:     Cervical: Cervical adenopathy present.  Skin:    General: Skin is warm and dry.  Neurological:     Mental Status: She is alert.  Psychiatric:        Mood and Affect: Mood normal.        Behavior: Behavior normal.     UC Treatments / Results  Labs (all labs ordered are listed, but only abnormal results are displayed) Labs Reviewed  POCT RAPID STREP A (OFFICE) - Abnormal; Notable for the following components:      Result Value   Rapid Strep A Screen Positive (*)    All other components within normal limits    EKG   Radiology No results found.  Procedures Procedures (including critical care time)  Medications Ordered in UC Medications - No data to display  Initial Impression / Assessment and Plan / UC Course  I have reviewed the triage vital signs and the nursing notes.  Pertinent labs & imaging results that were available during my care of the patient were reviewed by me and considered in my medical decision making (see chart for details).     Final Clinical Impressions(s) / UC Diagnoses   Final diagnoses:  Strep throat     Discharge Instructions      Take 10 full days of antibiotic Consider seeing an ENT specialist for repeated tonsilitis   ED Prescriptions     Medication Sig Dispense Auth. Provider   amoxicillin (AMOXIL) 875 MG tablet Take 1 tablet (875 mg total) by mouth 2 (two) times daily for 10 days. 20 tablet Eustace Moore, MD      PDMP not reviewed this encounter.   Eustace Moore, MD 09/30/21 [phone]

## 2021-09-30 NOTE — ED Triage Notes (Signed)
Pt presents to Urgent Care with c/o sore throat x 2 days, no other s/s reported. Has not done COVID test; pt is vaccinated.

## 2021-09-30 NOTE — Discharge Instructions (Signed)
Take 10 full days of antibiotic Consider seeing an ENT specialist for repeated tonsilitis

## 2022-03-19 ENCOUNTER — Encounter (HOSPITAL_COMMUNITY): Payer: Self-pay

## 2022-03-19 ENCOUNTER — Emergency Department (HOSPITAL_COMMUNITY)
Admission: EM | Admit: 2022-03-19 | Discharge: 2022-03-19 | Disposition: A | Discharge status: Home / Self Care | Payer: BC Managed Care – PPO | Attending: Emergency Medicine | Admitting: Emergency Medicine

## 2022-03-19 ENCOUNTER — Other Ambulatory Visit: Payer: Self-pay

## 2022-03-19 DIAGNOSIS — N9489 Other specified conditions associated with female genital organs and menstrual cycle: Secondary | ICD-10-CM | POA: Insufficient documentation

## 2022-03-19 DIAGNOSIS — R519 Headache, unspecified: Secondary | ICD-10-CM | POA: Diagnosis not present

## 2022-03-19 DIAGNOSIS — E876 Hypokalemia: Secondary | ICD-10-CM | POA: Insufficient documentation

## 2022-03-19 DIAGNOSIS — R112 Nausea with vomiting, unspecified: Secondary | ICD-10-CM | POA: Insufficient documentation

## 2022-03-19 DIAGNOSIS — R42 Dizziness and giddiness: Secondary | ICD-10-CM | POA: Diagnosis not present

## 2022-03-19 HISTORY — DX: Depression, unspecified: F32.A

## 2022-03-19 LAB — COMPREHENSIVE METABOLIC PANEL
ALT: 23 U/L (ref 0–44)
AST: 22 U/L (ref 15–41)
Albumin: 4.2 g/dL (ref 3.5–5.0)
Alkaline Phosphatase: 60 U/L (ref 38–126)
Anion gap: 10 (ref 5–15)
BUN: 9 mg/dL (ref 6–20)
CO2: 24 mmol/L (ref 22–32)
Calcium: 9.2 mg/dL (ref 8.9–10.3)
Chloride: 104 mmol/L (ref 98–111)
Creatinine, Ser: 0.62 mg/dL (ref 0.44–1.00)
GFR, Estimated: >60 mL/min (ref >60)
Glucose, Bld: 120 mg/dL — ABNORMAL HIGH (ref 70–99)
Potassium: 3.1 mmol/L — ABNORMAL LOW (ref 3.5–5.1)
Sodium: 138 mmol/L (ref 135–145)
Total Bilirubin: 0.3 mg/dL (ref 0.3–1.2)
Total Protein: 7.5 g/dL (ref 6.5–8.1)

## 2022-03-19 LAB — CBC
HCT: 40.4 % (ref 36.0–46.0)
Hemoglobin: 13.8 g/dL (ref 12.0–15.0)
MCH: 30.1 pg (ref 26.0–34.0)
MCHC: 34.2 g/dL (ref 30.0–36.0)
MCV: 88 fL (ref 80.0–100.0)
Platelets: 239 10*3/uL (ref 150–400)
RBC: 4.59 MIL/uL (ref 3.87–5.11)
RDW: 11.9 % (ref 11.5–15.5)
WBC: 7.9 10*3/uL (ref 4.0–10.5)
nRBC: 0 % (ref 0.0–0.2)

## 2022-03-19 LAB — I-STAT BETA HCG BLOOD, ED (MC, WL, AP ONLY): I-stat hCG, quantitative: <5 m[IU]/mL (ref <5)

## 2022-03-19 LAB — LIPASE, BLOOD: Lipase: 25 U/L (ref 11–51)

## 2022-03-19 MED ORDER — POTASSIUM CHLORIDE CRYS ER 20 MEQ PO TBCR
40.0000 meq | EXTENDED_RELEASE_TABLET | Freq: Once | ORAL | Status: AC
  Administered 2022-03-19: 40 meq via ORAL
  Filled 2022-03-19: qty 2

## 2022-03-19 MED ORDER — LACTATED RINGERS IV BOLUS
1000.0000 mL | Freq: Once | INTRAVENOUS | Status: AC
  Administered 2022-03-19: 1000 mL via INTRAVENOUS

## 2022-03-19 MED ORDER — PROCHLORPERAZINE EDISYLATE 10 MG/2ML IJ SOLN
10.0000 mg | INTRAMUSCULAR | Status: AC
  Administered 2022-03-19: 10 mg via INTRAVENOUS
  Filled 2022-03-19: qty 2

## 2022-03-19 MED ORDER — ONDANSETRON 4 MG PO TBDP
4.0000 mg | ORAL_TABLET | Freq: Once | ORAL | Status: AC | PRN
  Administered 2022-03-19: 4 mg via ORAL
  Filled 2022-03-19: qty 1

## 2022-03-19 MED ORDER — ONDANSETRON HCL 8 MG PO TABS: 8.0000 mg | ORAL_TABLET | Freq: Three times a day (TID) | ORAL | 0 refills | Status: AC | PRN

## 2022-03-19 NOTE — Discharge Instructions (Signed)
Please continue drinking clear liquids and slowly advance your diet today as tolerated ?Use acetaminophen as needed for headache or pain ?You are given a prescription for Zofran for nausea. ?Please eat a potassium rich diet as discussed. ?Have your potassium rechecked at your doctor's this week. ?Return if you are having any worsening symptoms especially fever or inability to tolerate liquids. ?

## 2022-03-19 NOTE — ED Notes (Signed)
Pt shown to lobby.  Verbalized understanding discharge instructions. In no acute distress.  ? ?

## 2022-03-19 NOTE — ED Notes (Signed)
Pt is aware we need a urine sample. 

## 2022-03-19 NOTE — ED Provider Notes (Signed)
?[facility]-EMERGENCY DEPT ?Provider Note ? ? ?CSN: [id number] ?Arrival date & time: 03/19/22  1034 ? ?  ? ?History ? ?Chief Complaint  ?Patient presents with  ? Emesis  ? Dizziness  ? ? ?Denise Mann is a 26 y.o. female. ? ?HPI ?26 year old female G1, P0 A1 presents today complaining of nausea and vomiting.  She states she began having symptoms last night.  She began with nausea and vomited multiple times throughout the night.  She denies blood or bile.  She is continued to vomit this morning.  She states that she has felt lightheaded and weak today secondary to this.  She has not had similar symptoms in the past.  She denies fever, chills, chest pain, dyspnea, UTI symptoms, vaginal discharge.  She states that she is on birth control pills.  Her last menstrual period was within the last week is normal.  She denies any pain with urination or frequency of urination. ? ?  ? ?Home Medications ?Prior to Admission medications   ?Medication Sig Start Date End Date Taking? Authorizing Provider  ?ondansetron (ZOFRAN) 8 MG tablet Take 1 tablet (8 mg total) by mouth every 8 (eight) hours as needed for nausea or vomiting. 03/19/22  Yes Margarita Grizzle, MD  ?Norethindrone-Ethinyl Estradiol-Fe Biphas (LO LOESTRIN FE) 1 MG-10 MCG / 10 MCG tablet Take 1 tablet by mouth daily.     [provider]  ?Cetirizine HCl 10 MG CAPS Take 1 capsule (10 mg total) by mouth daily for 15 days. ?Patient not taking: Reported on 07/31/2019 04/16/18 09/04/19  Wieters, Hallie C, PA-C  ?fluticasone (FLONASE) 50 MCG/ACT nasal spray Place 1 spray into both nostrils daily for 7 days. ?Patient not taking: Reported on 07/31/2019 04/16/18 09/04/19  Wieters, Fran Lowes C, PA-C  ?lactase (LACTAID) 3000 UNITS tablet Take 1 tablet (3,000 Units total) by mouth 3 (three) times daily with meals. ?Patient not taking: Reported on 07/31/2019 08/10/12 09/04/19  Jon Gills, MD  ?metoCLOPramide (REGLAN) 10 MG tablet Take 1 tablet (10 mg total) by  mouth every 6 (six) hours. ?Patient not taking: Reported on 07/31/2019 04/28/16 09/04/19  Janne Napoleon, NP  ?   ? ?Allergies    ?Sulfa antibiotics   ? ?Review of Systems   ?Review of Systems  ?Neurological:  Positive for headaches.  ?All other systems reviewed and are negative. ? ?Physical Exam ?Updated Vital Signs ?BP 101/73 Comment: Pt is lying on arm w/ cuff  Pulse 65   Temp 98.2 ?F (36.8 ?C) (Oral)   Resp 15   Ht 1.511 m (4' 11.5")   Wt 59 kg   LMP 03/17/2022   SpO2 98%   BMI 25.82 kg/m?  ?Physical Exam ?Vitals and nursing note reviewed.  ?Constitutional:   ?   General: She is not in acute distress. ?   Appearance: She is well-developed.  ?HENT:  ?   Head: Normocephalic and atraumatic.  ?   Right Ear: External ear normal.  ?   Left Ear: External ear normal.  ?   Nose: Nose normal.  ?Eyes:  ?   Conjunctiva/sclera: Conjunctivae normal.  ?   Pupils: Pupils are equal, round, and reactive to light.  ?Cardiovascular:  ?   Rate and Rhythm: Normal rate and regular rhythm.  ?Pulmonary:  ?   Effort: Pulmonary effort is normal.  ?Abdominal:  ?   General: Abdomen is flat. Bowel sounds are normal.  ?   Tenderness: There is abdominal tenderness.  ?   Comments: Left  periumbilical tenderness to palpation  ?Musculoskeletal:     ?   General: Normal range of motion.  ?   Cervical back: Normal range of motion and neck supple.  ?Skin: ?   General: Skin is warm and dry.  ?Neurological:  ?   Mental Status: She is alert and oriented to person, place, and time.  ?   Motor: No abnormal muscle tone.  ?   Coordination: Coordination normal.  ?Psychiatric:     ?   Behavior: Behavior normal.     ?   Thought Content: Thought content normal.  ? ? ?ED Results / Procedures / Treatments   ?Labs ?(all labs ordered are listed, but only abnormal results are displayed) ?Labs Reviewed  ?COMPREHENSIVE METABOLIC PANEL - Abnormal; Notable for the following components:  ?    Result Value  ? Potassium 3.1 (*)   ? Glucose, Bld 120 (*)   ? All other  components within normal limits  ?LIPASE, BLOOD  ?CBC  ?URINALYSIS, ROUTINE W REFLEX MICROSCOPIC  ?I-STAT BETA HCG BLOOD, ED ([facility], [facility], [facility] ONLY)  ? ? ?EKG ?None ? ?Radiology ?No results found. ? ?Procedures ?Procedures  ? ? ?Medications Ordered in ED ?Medications  ?potassium chloride SA (KLOR-CON M) CR tablet 40 mEq (has no administration in time range)  ?ondansetron (ZOFRAN-ODT) disintegrating tablet 4 mg (4 mg Oral Given 03/19/22 1053)  ?lactated ringers bolus 1,000 mL (1,000 mLs Intravenous New Bag/Given 03/19/22 1158)  ?prochlorperazine (COMPAZINE) injection 10 mg (10 mg Intravenous Given 03/19/22 1159)  ? ? ?ED Course/ Medical Decision Making/ A&P ?Clinical Course as of 03/19/22 1324  ?Tue Mar 19, 2022  ?1314 CBC reviewed interpreted normal [DR]  ?1314 Comprehensive metabolic panel(!) ?complete metabolic panel reviewed and interpreted with mild hypokalemia at 3.1 we will orally replete [DR]  ?1315 Lipase, blood ?Lipase reviewed interpreted and normal [DR]  ?1315 I-Stat beta hCG blood, ED ?I-STAT beta-hCG reviewed interpreted normal [DR]  ?1321 Patient states she feels improved and is tolerating fluids. [DR]  ?  ?Clinical Course User Index ?[DR] Margarita Grizzle, MD  ? ?                        ?Medical Decision Making ?26 year old female presents today with nausea and vomiting.  Patient given IV fluids and Compazine here.  She has some improvement of her headache and has been able to tolerate oral fluids.  Patient reevaluated with normal vital signs.  I reviewed her labs.  She has some mild hypokalemia being orally repleted here.  We have discussed this in Discussed a potassium rich diet and outpatient recheck.  Plan discharge with Zofran and advancing diet. ?Discussed return precautions need for follow-up and voices understanding. ? ?Amount and/or Complexity of Data Reviewed ?Labs: ordered. Decision-making details documented in ED Course. ? ?Risk ?Prescription drug management. ? ? ? ? ? ? ? ? ? ? ?Final Clinical  Impression(s) / ED Diagnoses ?Final diagnoses:  ?Nausea and vomiting, unspecified vomiting type  ?Hypokalemia  ?Intractable headache, unspecified chronicity pattern, unspecified headache type  ? ? ?Rx / DC Orders ?ED Discharge Orders   ? ?      Ordered  ?  ondansetron (ZOFRAN) 8 MG tablet  Every 8 hours PRN       ? 03/19/22 1323  ? ?  ?  ? ?  ? ? ?  ?Margarita Grizzle, MD ?03/19/22 1324 ? ?

## 2022-03-19 NOTE — ED Triage Notes (Signed)
Patient c/o emesis and dizziness since last night. Patient denies abdominal pain and diarrhea. ?

## 2022-06-02 IMAGING — DX DG CHEST 1V PORT
1 series · 1 of 1 positions shown · non-contrast
Comparison: Chest radiograph 04/16/2018

CLINICAL DATA: Pt c/o headache, body aches, loss of taste and
smell, and chills for several days.

EXAM:
PORTABLE CHEST 1 VIEW

[chest ap]
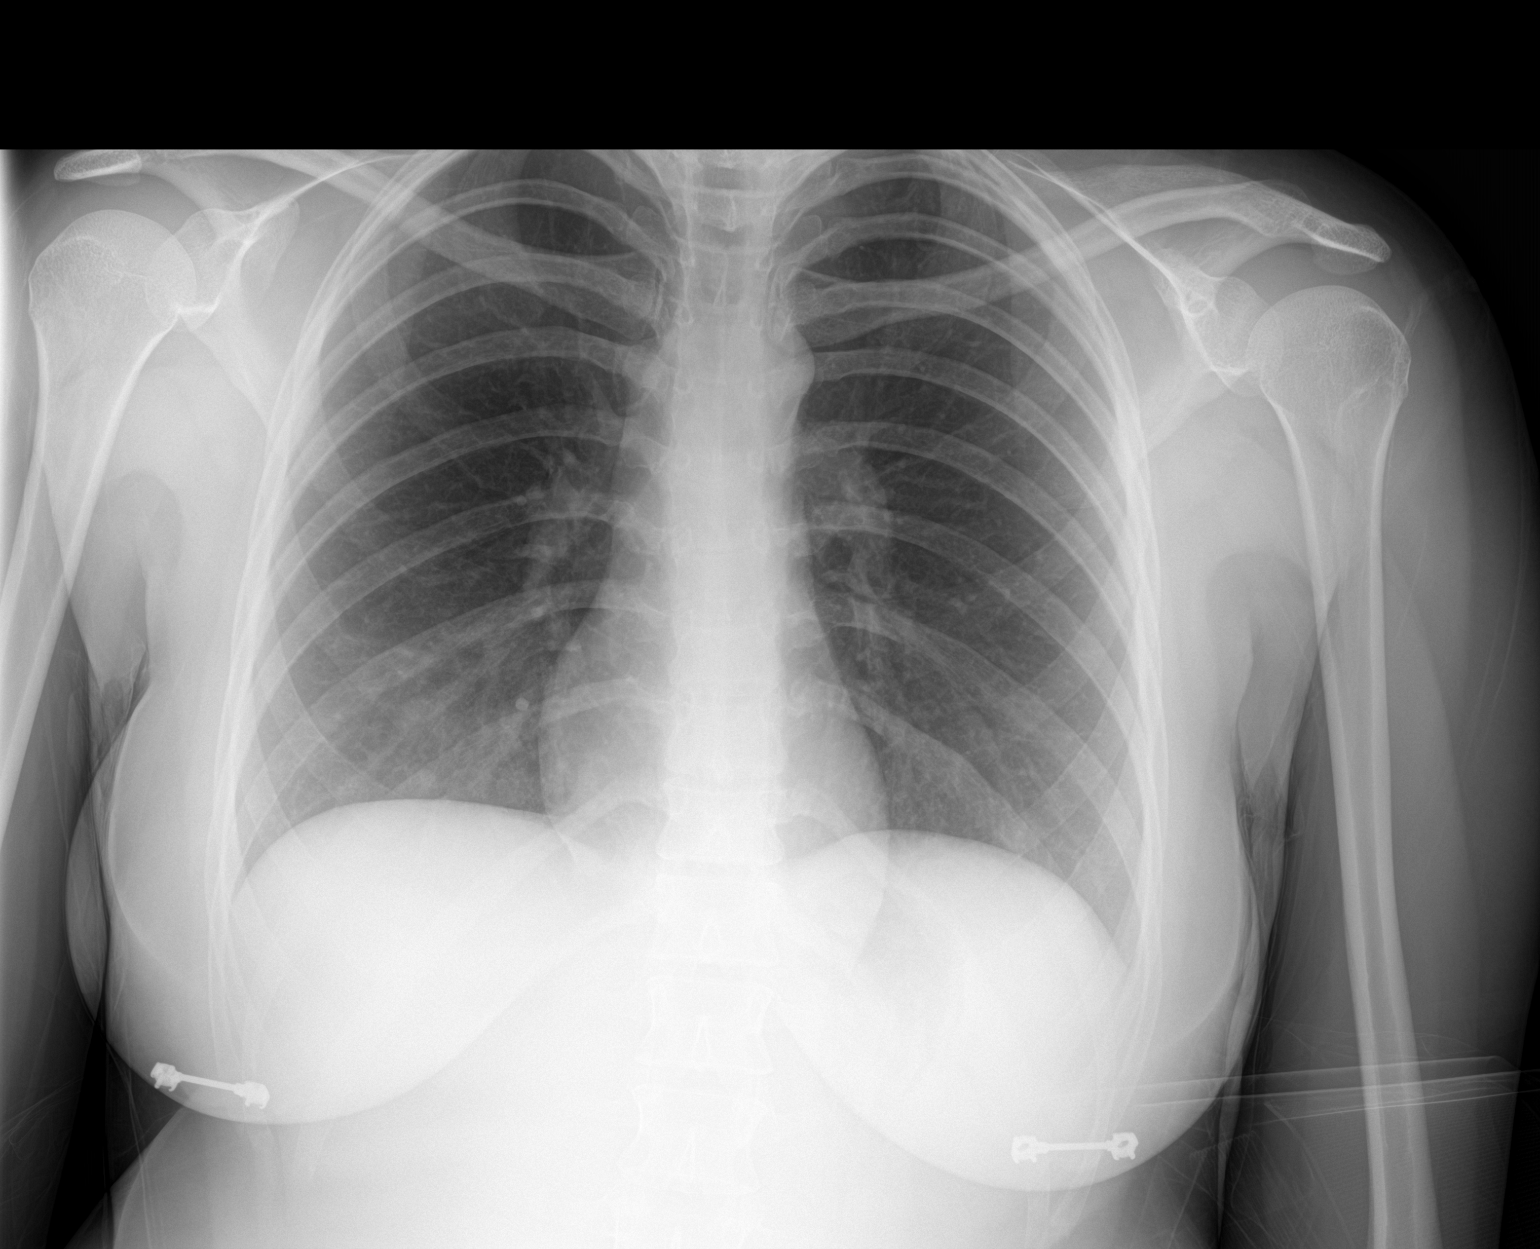

[1 of 1 positions shown; findings below may reference images not displayed]

FINDINGS: The cardiomediastinal contours are within normal limits. The lungs
are clear. No pneumothorax or pleural effusion. No acute finding in
the visualized skeleton.
IMPRESSION: No acute cardiopulmonary process.

## 2022-06-15 ENCOUNTER — Encounter: Payer: Self-pay | Admitting: *Deleted

## 2022-11-22 DIAGNOSIS — Z419 Encounter for procedure for purposes other than remedying health state, unspecified: Secondary | ICD-10-CM | POA: Diagnosis not present

## 2022-12-23 DIAGNOSIS — Z419 Encounter for procedure for purposes other than remedying health state, unspecified: Secondary | ICD-10-CM | POA: Diagnosis not present

## 2023-01-23 DIAGNOSIS — Z419 Encounter for procedure for purposes other than remedying health state, unspecified: Secondary | ICD-10-CM | POA: Diagnosis not present

## 2023-02-21 DIAGNOSIS — Z419 Encounter for procedure for purposes other than remedying health state, unspecified: Secondary | ICD-10-CM | POA: Diagnosis not present

## 2023-03-24 DIAGNOSIS — Z419 Encounter for procedure for purposes other than remedying health state, unspecified: Secondary | ICD-10-CM | POA: Diagnosis not present

## 2023-04-23 DIAGNOSIS — Z419 Encounter for procedure for purposes other than remedying health state, unspecified: Secondary | ICD-10-CM | POA: Diagnosis not present

## 2023-05-24 DIAGNOSIS — Z419 Encounter for procedure for purposes other than remedying health state, unspecified: Secondary | ICD-10-CM | POA: Diagnosis not present

## 2023-06-23 DIAGNOSIS — Z419 Encounter for procedure for purposes other than remedying health state, unspecified: Secondary | ICD-10-CM | POA: Diagnosis not present

## 2023-07-24 DIAGNOSIS — Z419 Encounter for procedure for purposes other than remedying health state, unspecified: Secondary | ICD-10-CM | POA: Diagnosis not present

## 2023-08-24 DIAGNOSIS — Z419 Encounter for procedure for purposes other than remedying health state, unspecified: Secondary | ICD-10-CM | POA: Diagnosis not present

## 2023-09-23 DIAGNOSIS — Z419 Encounter for procedure for purposes other than remedying health state, unspecified: Secondary | ICD-10-CM | POA: Diagnosis not present

## 2023-10-24 DIAGNOSIS — Z419 Encounter for procedure for purposes other than remedying health state, unspecified: Secondary | ICD-10-CM | POA: Diagnosis not present

## 2023-11-23 DIAGNOSIS — Z419 Encounter for procedure for purposes other than remedying health state, unspecified: Secondary | ICD-10-CM | POA: Diagnosis not present

## 2023-12-24 DIAGNOSIS — Z419 Encounter for procedure for purposes other than remedying health state, unspecified: Secondary | ICD-10-CM | POA: Diagnosis not present

## 2024-01-24 DIAGNOSIS — Z419 Encounter for procedure for purposes other than remedying health state, unspecified: Secondary | ICD-10-CM | POA: Diagnosis not present

## 2024-02-21 DIAGNOSIS — Z419 Encounter for procedure for purposes other than remedying health state, unspecified: Secondary | ICD-10-CM | POA: Diagnosis not present

## 2024-04-03 DIAGNOSIS — Z419 Encounter for procedure for purposes other than remedying health state, unspecified: Secondary | ICD-10-CM | POA: Diagnosis not present

## 2024-04-24 ENCOUNTER — Ambulatory Visit
Admission: EM | Admit: 2024-04-24 | Discharge: 2024-04-24 | Disposition: A | Discharge status: Home / Self Care | Attending: Internal Medicine | Admitting: Internal Medicine

## 2024-04-24 ENCOUNTER — Other Ambulatory Visit: Payer: Self-pay

## 2024-04-24 DIAGNOSIS — J4521 Mild intermittent asthma with (acute) exacerbation: Secondary | ICD-10-CM | POA: Diagnosis not present

## 2024-04-24 DIAGNOSIS — R053 Chronic cough: Secondary | ICD-10-CM

## 2024-04-24 DIAGNOSIS — J029 Acute pharyngitis, unspecified: Secondary | ICD-10-CM

## 2024-04-24 LAB — POCT RAPID STREP A (OFFICE): Rapid Strep A Screen: NEGATIVE

## 2024-04-24 MED ORDER — BENZONATATE 100 MG PO CAPS: 100.0000 mg | ORAL_CAPSULE | Freq: Three times a day (TID) | ORAL | 0 refills | Status: AC | PRN

## 2024-04-24 MED ORDER — PREDNISONE 20 MG PO TABS: 40.0000 mg | ORAL_TABLET | Freq: Every day | ORAL | 0 refills | Status: AC

## 2024-04-24 MED ORDER — ALBUTEROL SULFATE HFA 108 (90 BASE) MCG/ACT IN AERS: 1.0000 | INHALATION_SPRAY | Freq: Four times a day (QID) | RESPIRATORY_TRACT | 0 refills | Status: AC | PRN

## 2024-04-24 NOTE — Discharge Instructions (Signed)
 Rapid strep is negative.  Throat culture is pending as we discussed.  I have prescribed you 3 medications including a refill on your albuterol  inhaler which should be helpful in improving your symptoms.  Please follow-up if any symptoms persist or worsen.

## 2024-04-24 NOTE — ED Triage Notes (Signed)
 Pt c/o cough and sore throat x 2 weeks, been worse since Thursday.Taking benzonatate  last week, using albuterol  inhaler.

## 2024-04-24 NOTE — ED Provider Notes (Signed)
 [facility]    CSN: [id number] Arrival date & time: 04/24/24  0949      History   Chief Complaint Chief Complaint  Patient presents with   Cough    HPI Denise Mann is a 28 y.o. female.   Patient presents for cough and sore throat for the past two weeks. Cough is productive at times. Patient reports harsh coughing fits that "cause her to lose her breath." States that she has a history of asthma that has not flared up since childhood. Has been using albuterol  inhaler and her a few pills of her sister's benzonatate  cough medication. Her sister has had similar symptoms. Patient states this is the first time she has been seen since symptoms started. Denies any fever.    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 No pertinent surgical history.  OB History   No obstetric history on file.      Home Medications    Prior to Admission medications   Medication Sig Start Date End Date Taking? Authorizing Provider  albuterol  (VENTOLIN  HFA) 108 (90 Base) MCG/ACT inhaler Inhale 1-2 puffs into the lungs every 6 (six) hours as needed for wheezing or shortness of breath. 04/24/24  Yes Graviel Payeur, Dewey Fordyce, FNP  benzonatate  (TESSALON ) 100 MG capsule Take 1 capsule (100 mg total) by mouth every 8 (eight) hours as needed for cough. 04/24/24  Yes Margy Sumler, Fairy Homer E, FNP  Norethindrone-Ethinyl Estradiol-Fe Biphas (LO LOESTRIN FE) 1 MG-10 MCG / 10 MCG tablet Take 1 tablet by mouth daily.    Yes [provider]  predniSONE  (DELTASONE ) 20 MG tablet Take 2 tablets (40 mg total) by mouth daily for 5 days. 04/24/24 04/29/24 Yes Thekla Colborn, Dewey Fordyce, FNP  ondansetron  (ZOFRAN ) 8 MG tablet Take 1 tablet (8 mg total) by mouth every 8 (eight) hours as needed for nausea or  vomiting. Patient not taking: Reported on 04/24/2024 03/19/22   Auston Blush, MD  Cetirizine  HCl 10 MG CAPS Take 1 capsule (10 mg total) by mouth daily for 15 days. Patient not taking: Reported on 07/31/2019 04/16/18 09/04/19  Wieters, Hallie C, PA-C  fluticasone  (FLONASE ) 50 MCG/ACT nasal spray Place 1 spray into both nostrils daily for 7 days. Patient not taking: Reported on 07/31/2019 04/16/18 09/04/19  Wieters, Hallie C, PA-C  lactase (LACTAID) 3000 UNITS tablet Take 1 tablet (3,000 Units total) by mouth 3 (three) times daily with meals. Patient not taking: Reported on 07/31/2019 08/10/12 09/04/19  Fortunato Ill, MD  metoCLOPramide  (REGLAN ) 10 MG tablet Take 1 tablet (10 mg total) by mouth every 6 (six) hours. Patient not taking: Reported on 07/31/2019 04/28/16 09/04/19  Hardie Leyland, NP    Family History Family History  Problem Relation Age of Onset   Hypertension Mother    Diabetes Mother    Asthma Mother    Cancer Father     Social History Social History   Tobacco Use   Smoking status: Never   Smokeless tobacco: Never  Vaping Use   Vaping status: Former  Substance Use Topics   Alcohol use: Never   Drug use: Never     Allergies   Sulfa antibiotics   Review of Systems Review  of Systems Per HPI  Physical Exam Triage Vital Signs ED Triage Vitals  Encounter Vitals Group     BP 04/24/24 1002 100/79     Systolic BP Percentile --      Diastolic BP Percentile --      Pulse Rate 04/24/24 1002 86     Resp 04/24/24 1002 18     Temp 04/24/24 1002 98.3 F (36.8 C)     Temp Source 04/24/24 1002 Oral     SpO2 04/24/24 1002 96 %     Weight --      Height --      Head Circumference --      Peak Flow --      Pain Score 04/24/24 0959 4     Pain Loc --      Pain Education --      Exclude from Growth Chart --    No data found.  Updated Vital Signs BP 100/79 (BP Location: Right Arm)   Pulse 86   Temp 98.3 F (36.8 C) (Oral)   Resp 18   LMP 04/09/2024 (Exact Date)   SpO2 96%    Visual Acuity Right Eye Distance:   Left Eye Distance:   Bilateral Distance:    Right Eye Near:   Left Eye Near:    Bilateral Near:     Physical Exam Constitutional:      General: She is not in acute distress.    Appearance: Normal appearance. She is not toxic-appearing or diaphoretic.  HENT:     Head: Normocephalic and atraumatic.     Right Ear: Tympanic membrane and ear canal normal.     Left Ear: Tympanic membrane and ear canal normal.     Nose: Congestion present.     Mouth/Throat:     Mouth: Mucous membranes are moist.     Pharynx: Posterior oropharyngeal erythema present.  Eyes:     Extraocular Movements: Extraocular movements intact.     Conjunctiva/sclera: Conjunctivae normal.     Pupils: Pupils are equal, round, and reactive to light.  Cardiovascular:     Rate and Rhythm: Normal rate and regular rhythm.     Pulses: Normal pulses.     Heart sounds: Normal heart sounds.  Pulmonary:     Effort: Pulmonary effort is normal. No respiratory distress.     Breath sounds: Normal breath sounds. No stridor. No wheezing, rhonchi or rales.  Musculoskeletal:        General: Normal range of motion.     Cervical back: Normal range of motion.  Skin:    General: Skin is warm and dry.  Neurological:     General: No focal deficit present.     Mental Status: She is alert and oriented to person, place, and time. Mental status is at baseline.  Psychiatric:        Mood and Affect: Mood normal.        Behavior: Behavior normal.      UC Treatments / Results  Labs (all labs ordered are listed, but only abnormal results are displayed) Labs Reviewed  POCT RAPID STREP A (OFFICE) - Normal  CULTURE, GROUP A STREP [facility])    EKG   Radiology No results found.  Procedures Procedures (including critical care time)  Medications Ordered in UC Medications - No data to display  Initial Impression / Assessment and Plan / UC Course  I have reviewed the triage vital signs and the  nursing notes.  Pertinent labs & imaging results  that were available during my care of the patient were reviewed by me and considered in my medical decision making (see chart for details).     Suspect persistent viral illness, most likely viral bronchitis versus mild asthma exacerbation. Will treat with prednisone  steroid burst. Patient requesting benzonatate  for cough and a refill for albuterol  inhaler so this was prescribed as well. Rapid strep was completed given appearance of posterior pharynx on exam and it was negative. Will send throat culture. Viral testing deferred given duration of symptoms as it would not change treatment. Chest imaging deferred given vital signs are stable, there is no tachypnea, and there are no adventitious lung sounds on exam. Patient advised to follow up if any symptoms persist or worsen. Patient verbalized understanding and was agreeable with plan.  Final Clinical Impressions(s) / UC Diagnoses   Final diagnoses:  Persistent cough  Sore throat  Mild intermittent asthma with acute exacerbation     Discharge Instructions      Rapid strep is negative.  Throat culture is pending as we discussed.  I have prescribed you 3 medications including a refill on your albuterol  inhaler which should be helpful in improving your symptoms.  Please follow-up if any symptoms persist or worsen.    ED Prescriptions     Medication Sig Dispense Auth. Provider   benzonatate  (TESSALON ) 100 MG capsule Take 1 capsule (100 mg total) by mouth every 8 (eight) hours as needed for cough. 21 capsule Pulcifer, Annamaria Salah E, FNP   albuterol  (VENTOLIN  HFA) 108 (90 Base) MCG/ACT inhaler Inhale 1-2 puffs into the lungs every 6 (six) hours as needed for wheezing or shortness of breath. 1 each [place], Jal E, FNP   predniSONE  (DELTASONE ) 20 MG tablet Take 2 tablets (40 mg total) by mouth daily for 5 days. 10 tablet Deziree Mokry E, [place]      PDMP not reviewed this encounter.   Dodson Freestone,  [place] 04/24/24 1115

## 2024-04-28 ENCOUNTER — Telehealth: Payer: Self-pay

## 2024-04-28 DIAGNOSIS — Z1159 Encounter for screening for other viral diseases: Secondary | ICD-10-CM | POA: Diagnosis not present

## 2024-04-28 DIAGNOSIS — T753XXA Motion sickness, initial encounter: Secondary | ICD-10-CM | POA: Diagnosis not present

## 2024-04-28 DIAGNOSIS — Z020 Encounter for examination for admission to educational institution: Secondary | ICD-10-CM | POA: Diagnosis not present

## 2024-04-28 DIAGNOSIS — Z111 Encounter for screening for respiratory tuberculosis: Secondary | ICD-10-CM | POA: Diagnosis not present

## 2024-04-28 LAB — SPECIMEN STATUS REPORT

## 2024-04-28 NOTE — Telephone Encounter (Signed)
 Telephone from [facility] requesting order number for strep culture

## 2024-05-03 DIAGNOSIS — Z419 Encounter for procedure for purposes other than remedying health state, unspecified: Secondary | ICD-10-CM | POA: Diagnosis not present

## 2024-06-03 DIAGNOSIS — Z419 Encounter for procedure for purposes other than remedying health state, unspecified: Secondary | ICD-10-CM | POA: Diagnosis not present

## 2024-06-11 DIAGNOSIS — M67432 Ganglion, left wrist: Secondary | ICD-10-CM | POA: Diagnosis not present

## 2024-06-28 DIAGNOSIS — Z3009 Encounter for other general counseling and advice on contraception: Secondary | ICD-10-CM | POA: Diagnosis not present

## 2024-07-03 DIAGNOSIS — Z419 Encounter for procedure for purposes other than remedying health state, unspecified: Secondary | ICD-10-CM | POA: Diagnosis not present

## 2024-08-03 DIAGNOSIS — Z419 Encounter for procedure for purposes other than remedying health state, unspecified: Secondary | ICD-10-CM | POA: Diagnosis not present

## 2024-08-06 DIAGNOSIS — Z13220 Encounter for screening for lipoid disorders: Secondary | ICD-10-CM | POA: Diagnosis not present

## 2024-08-06 DIAGNOSIS — E7800 Pure hypercholesterolemia, unspecified: Secondary | ICD-10-CM | POA: Diagnosis not present

## 2024-08-06 DIAGNOSIS — J020 Streptococcal pharyngitis: Secondary | ICD-10-CM | POA: Diagnosis not present

## 2024-08-06 DIAGNOSIS — R590 Localized enlarged lymph nodes: Secondary | ICD-10-CM | POA: Diagnosis not present

## 2024-08-06 DIAGNOSIS — H66001 Acute suppurative otitis media without spontaneous rupture of ear drum, right ear: Secondary | ICD-10-CM | POA: Diagnosis not present

## 2024-08-07 DIAGNOSIS — M67432 Ganglion, left wrist: Secondary | ICD-10-CM | POA: Diagnosis not present

## 2024-08-09 DIAGNOSIS — J029 Acute pharyngitis, unspecified: Secondary | ICD-10-CM | POA: Diagnosis not present

## 2024-09-02 DIAGNOSIS — M67432 Ganglion, left wrist: Secondary | ICD-10-CM | POA: Diagnosis not present

## 2024-09-03 DIAGNOSIS — Z419 Encounter for procedure for purposes other than remedying health state, unspecified: Secondary | ICD-10-CM | POA: Diagnosis not present

## 2024-10-03 DIAGNOSIS — Z419 Encounter for procedure for purposes other than remedying health state, unspecified: Secondary | ICD-10-CM | POA: Diagnosis not present

## 2024-10-21 DIAGNOSIS — H6993 Unspecified Eustachian tube disorder, bilateral: Secondary | ICD-10-CM | POA: Diagnosis not present

## 2024-10-21 DIAGNOSIS — Z0110 Encounter for examination of ears and hearing without abnormal findings: Secondary | ICD-10-CM | POA: Diagnosis not present

## 2024-10-27 ENCOUNTER — Telehealth: Payer: Self-pay | Admitting: Internal Medicine

## 2024-10-27 ENCOUNTER — Ambulatory Visit
Admission: EM | Admit: 2024-10-27 | Discharge: 2024-10-27 | Disposition: A | Discharge status: Home / Self Care | Attending: Internal Medicine | Admitting: Internal Medicine

## 2024-10-27 DIAGNOSIS — M7989 Other specified soft tissue disorders: Secondary | ICD-10-CM | POA: Diagnosis not present

## 2024-10-27 MED ORDER — AMOXICILLIN-POT CLAVULANATE 875-125 MG PO TABS: 1.0000 | ORAL_TABLET | Freq: Two times a day (BID) | ORAL | 0 refills | Status: DC

## 2024-10-27 NOTE — ED Triage Notes (Signed)
 Pt c/o swelling under left arm pit x 1 week. No home intervention

## 2024-10-27 NOTE — ED Provider Notes (Signed)
 [facility]    CSN: [id number] Arrival date & time: 10/27/24  1016      History   Chief Complaint Chief Complaint  Patient presents with   left arm swelling    HPI Ramonica Grigg is a 28 y.o. female.   Patient presents with concern of swelling to left armpit that started about 1 week ago. Denies fever and reports minimal pain only to palpation. Denies history of the same. Denies any breast pain or lumps. Denies family history of cancer.      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 No pertinent surgical history.  OB History   No obstetric history on file.      Home Medications    Prior to Admission medications   Medication Sig Start Date End Date Taking? Authorizing Provider  amoxicillin -clavulanate (AUGMENTIN) 875-125 MG tablet Take 1 tablet by mouth every 12 (twelve) hours. 10/27/24  Yes Nishanth Mccaughan, Darryle E, FNP  Norethindrone-Ethinyl Estradiol-Fe Biphas (LO LOESTRIN FE) 1 MG-10 MCG / 10 MCG tablet Take 1 tablet by mouth daily.    Yes [provider]  albuterol  (VENTOLIN  HFA) 108 (90 Base) MCG/ACT inhaler Inhale 1-2 puffs into the lungs every 6 (six) hours as needed for wheezing or shortness of breath. 04/24/24   Hazen Darryle BRAVO, FNP  benzonatate  (TESSALON ) 100 MG capsule Take 1 capsule (100 mg total) by mouth every 8 (eight) hours as needed for cough. Patient not taking: Reported on 10/27/2024 04/24/24   Hazen Darryle BRAVO, FNP  ondansetron  (ZOFRAN ) 8 MG tablet Take 1 tablet (8 mg total) by mouth every 8 (eight) hours as needed for nausea or vomiting. Patient not taking: Reported on 04/24/2024 03/19/22   Levander Houston, MD  Cetirizine  HCl 10 MG CAPS Take 1 capsule (10 mg total) by mouth daily for 15 days. Patient not taking: Reported on  07/31/2019 04/16/18 09/04/19  Wieters, Hallie C, PA-C  fluticasone  (FLONASE ) 50 MCG/ACT nasal spray Place 1 spray into both nostrils daily for 7 days. Patient not taking: Reported on 07/31/2019 04/16/18 09/04/19  Wieters, Hallie C, PA-C  lactase (LACTAID) 3000 UNITS tablet Take 1 tablet (3,000 Units total) by mouth 3 (three) times daily with meals. Patient not taking: Reported on 07/31/2019 08/10/12 09/04/19  Gretta Fairy DEL, MD  metoCLOPramide  (REGLAN ) 10 MG tablet Take 1 tablet (10 mg total) by mouth every 6 (six) hours. Patient not taking: Reported on 07/31/2019 04/28/16 09/04/19  Jamelle Lorrayne HERO, NP    Family History Family History  Problem Relation Age of Onset   Hypertension Mother    Diabetes Mother    Asthma Mother    Cancer Father     Social History Social History   Tobacco Use   Smoking status: Never    Passive exposure: Never   Smokeless tobacco: Never  Vaping Use   Vaping status: Former  Substance Use Topics   Alcohol use: Never   Drug use: Never     Allergies   Sulfa antibiotics   Review of Systems Review of Systems Per HPI  Physical Exam Triage Vital Signs ED Triage Vitals  Encounter Vitals Group     BP 10/27/24 1027 104/73     Girls Systolic BP Percentile --  Girls Diastolic BP Percentile --      Boys Systolic BP Percentile --      Boys Diastolic BP Percentile --      Pulse Rate 10/27/24 1027 88     Resp 10/27/24 1027 18     Temp 10/27/24 1027 98.2 F (36.8 C)     Temp Source 10/27/24 1027 Oral     SpO2 10/27/24 1027 97 %     Weight --      Height --      Head Circumference --      Peak Flow --      Pain Score 10/27/24 1025 2     Pain Loc --      Pain Education --      Exclude from Growth Chart --    No data found.  Updated Vital Signs BP 104/73 (BP Location: Left Arm)   Pulse 88   Temp 98.2 F (36.8 C) (Oral)   Resp 18   LMP 10/21/2024 (Exact Date)   SpO2 97%   Visual Acuity Right Eye Distance:   Left Eye Distance:   Bilateral Distance:     Right Eye Near:   Left Eye Near:    Bilateral Near:     Physical Exam Exam conducted with a chaperone present.  Constitutional:      General: She is not in acute distress.    Appearance: Normal appearance. She is not toxic-appearing or diaphoretic.  HENT:     Head: Normocephalic and atraumatic.  Eyes:     Extraocular Movements: Extraocular movements intact.     Conjunctiva/sclera: Conjunctivae normal.  Pulmonary:     Effort: Pulmonary effort is normal.  Chest:     Chest wall: No mass, swelling or edema.  Breasts:    Breasts are symmetrical.     Right: No swelling, inverted nipple, mass, nipple discharge, skin change or tenderness.     Left: No swelling, inverted nipple, mass, nipple discharge, skin change or tenderness.     Comments: Nipple piercings present bilaterally. No other abnormality noted. Lymphadenopathy:     Upper Body:     Left upper body: Axillary adenopathy present.     Comments: Mild left axillary posterior versus central lymph node swelling. No discoloration. Minimal pain to palpation.   Neurological:     General: No focal deficit present.     Mental Status: She is alert and oriented to person, place, and time. Mental status is at baseline.  Psychiatric:        Mood and Affect: Mood normal.        Behavior: Behavior normal.        Thought Content: Thought content normal.        Judgment: Judgment normal.      UC Treatments / Results  Labs (all labs ordered are listed, but only abnormal results are displayed) Labs Reviewed  CBC  BASIC METABOLIC PANEL WITH GFR    EKG   Radiology No results found.  Procedures Procedures (including critical care time)  Medications Ordered in UC Medications - No data to display  Initial Impression / Assessment and Plan / UC Course  I have reviewed the triage vital signs and the nursing notes.  Pertinent labs & imaging results that were available during my care of the patient were reviewed by me and  considered in my medical decision making (see chart for details).     Differential diagnosis include axillary abscess versus cyst versus axillary lymph node swelling. Swelling  is minimal and only noted to palpation. NO I&D indicated. Therefore, will obtain breast ultrasound with left axilla at the [facility]. This order was placed. Provided patient with contact information and patient was advised to call to facilitate scheduling the appointment. Awaiting results. Will also treat with Augmentin to treat any infection associated with lymph node swelling versus abscess. BMP and CBC pending. Advised PCP follow up as well. Patient verbalized understanding and was agreeable with plan.  Final Clinical Impressions(s) / UC Diagnoses   Final diagnoses:  Left axillary swelling     Discharge Instructions      I have prescribed an antibiotic to treat any infection associated with the swelling in your armpit. Blood work is pending. We will call ONLY if it is abnormal. An order has been placed to the [facility] where they will ultrasound your left breast and left armpit. Please call them to ensure that the appointment is scheduled. If there is a problem with the order, please call us . Please also follow up with your PCP.    ED Prescriptions     Medication Sig Dispense Auth. Provider   amoxicillin -clavulanate (AUGMENTIN) 875-125 MG tablet Take 1 tablet by mouth every 12 (twelve) hours. 14 tablet [place], [place]et [place] E, [place]      PDMP not reviewed this encounter.   Hazen Darryle BRAVO, [place] 10/27/24 1113

## 2024-10-27 NOTE — Discharge Instructions (Signed)
 I have prescribed an antibiotic to treat any infection associated with the swelling in your armpit. Blood work is pending. We will call ONLY if it is abnormal. An order has been placed to the [facility] where they will ultrasound your left breast and left armpit. Please call them to ensure that the appointment is scheduled. If there is a problem with the order, please call us . Please also follow up with your PCP.

## 2024-10-27 NOTE — Telephone Encounter (Signed)
 Received an order to cosign that was changed by [facility]. The order was changed from an ultrasound of the left breast and axilla to only the left axilla. Called [facility] to inquire about the change in order. They stated that they spoke with the patient and given the swelling is localized to the left axilla, they changed the order as they do not feel the breast imaging is necessary. They also stated that the radiologist on site will determine if any further imaging such as of the breast is necessary once axillary ultrasound is completed. Called patient to discuss this process with her and highly encouraged follow up with PCP in case any further imaging is necessary. Patient verbalized understanding.

## 2024-10-28 ENCOUNTER — Inpatient Hospital Stay: Admission: RE | Admit: 2024-10-28 | Discharge: 2024-10-28 | Attending: Internal Medicine

## 2024-10-28 DIAGNOSIS — R599 Enlarged lymph nodes, unspecified: Secondary | ICD-10-CM | POA: Diagnosis not present

## 2024-10-28 LAB — BASIC METABOLIC PANEL WITH GFR
BUN/Creatinine Ratio: 24 — ABNORMAL HIGH (ref 9–23)
BUN: 14 mg/dL (ref 6–20)
Calcium: 9.6 mg/dL (ref 8.7–10.2)
Chloride: 106 mmol/L (ref 96–106)
Creatinine, Ser: 0.58 mg/dL (ref 0.57–1.00)
Glucose: 92 mg/dL (ref 70–99)
Potassium: 4.5 mmol/L (ref 3.5–5.2)
Sodium: 141 mmol/L (ref 134–144)
eGFR: 128 mL/min/1.73 (ref >59)

## 2024-10-28 LAB — CBC
Hematocrit: 42 % (ref 34.0–46.6)
Hemoglobin: 14 g/dL (ref 11.1–15.9)
MCH: 29.9 pg (ref 26.6–33.0)
MCHC: 33.3 g/dL (ref 31.5–35.7)
MCV: 90 fL (ref 79–97)
Platelets: 293 x10E3/uL (ref 150–450)
RBC: 4.68 x10E6/uL (ref 3.77–5.28)
RDW: 12.5 % (ref 11.7–15.4)
WBC: 7.6 x10E3/uL (ref 3.4–10.8)

## 2024-10-29 ENCOUNTER — Ambulatory Visit: Payer: Self-pay | Admitting: Internal Medicine

## 2024-11-19 DIAGNOSIS — R2232 Localized swelling, mass and lump, left upper limb: Secondary | ICD-10-CM | POA: Diagnosis not present

## 2024-11-23 DIAGNOSIS — R2232 Localized swelling, mass and lump, left upper limb: Secondary | ICD-10-CM | POA: Diagnosis not present

## 2024-12-03 DIAGNOSIS — Z419 Encounter for procedure for purposes other than remedying health state, unspecified: Secondary | ICD-10-CM | POA: Diagnosis not present

## 2024-12-07 DIAGNOSIS — Z9889 Other specified postprocedural states: Secondary | ICD-10-CM | POA: Diagnosis not present

## 2025-01-05 ENCOUNTER — Ambulatory Visit
Admission: EM | Admit: 2025-01-05 | Discharge: 2025-01-05 | Disposition: A | Discharge status: Home / Self Care | Attending: Internal Medicine | Admitting: Internal Medicine

## 2025-01-05 DIAGNOSIS — U071 COVID-19: Secondary | ICD-10-CM | POA: Diagnosis not present

## 2025-01-05 DIAGNOSIS — R051 Acute cough: Secondary | ICD-10-CM | POA: Diagnosis present

## 2025-01-05 DIAGNOSIS — J029 Acute pharyngitis, unspecified: Secondary | ICD-10-CM

## 2025-01-05 LAB — POCT INFLUENZA A/B
Influenza A, POC: NEGATIVE
Influenza B, POC: NEGATIVE

## 2025-01-05 LAB — POC SOFIA SARS ANTIGEN FIA: SARS Coronavirus 2 Ag: POSITIVE — AB

## 2025-01-05 LAB — POCT RAPID STREP A (OFFICE): Rapid Strep A Screen: NEGATIVE

## 2025-01-05 NOTE — ED Provider Notes (Signed)
 [facility]    CSN: [id number] Arrival date & time: 01/05/25  1259      History   Chief Complaint Chief Complaint  Patient presents with   Sore Throat   Cough    HPI Denise Mann is a 29 y.o. female.   Patient presents with 3 day history of cough, sore throat, nasal congestion, nausea, vomiting. She reports that she thinks that nausea is due to mucus. Denies diarrhea. Reports her sister has also been sick. Denies fever at home. Has taken mucinex  and tylenol . She reports difficulty keeping water and food down today. She does have a water bottle in exam room with her that she has been drinking and has been able to keep down while in urgent care. Reports history of asthma but no complications in a while or since being sick. Patient is requesting covid, flu, and strep testing.    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 No pertinent surgical history.  OB History   No obstetric history on file.      Home Medications    Prior to Admission medications  Medication Sig Start Date End Date Taking? Authorizing Provider  Norethindrone-Ethinyl Estradiol-Fe Biphas (LO LOESTRIN FE) 1 MG-10 MCG / 10 MCG tablet Take 1 tablet by mouth daily.    Yes [provider]  albuterol  (VENTOLIN  HFA) 108 (90 Base) MCG/ACT inhaler Inhale 1-2 puffs into the lungs every 6 (six) hours as needed for wheezing or shortness of breath. Patient not taking: Reported on 01/05/2025 04/24/24   Daiquan Resnik E, FNP  amoxicillin -clavulanate (AUGMENTIN ) 875-125 MG tablet Take 1 tablet by mouth every 12 (twelve) hours. 10/27/24   Hazen Darryle BRAVO, FNP  benzonatate  (TESSALON ) 100 MG capsule Take 1 capsule (100 mg total) by mouth every 8 (eight) hours as  needed for cough. Patient not taking: Reported on 10/27/2024 04/24/24   Hazen Darryle BRAVO, FNP  ondansetron  (ZOFRAN ) 8 MG tablet Take 1 tablet (8 mg total) by mouth every 8 (eight) hours as needed for nausea or vomiting. Patient not taking: Reported on 04/24/2024 03/19/22   Levander Houston, MD  Cetirizine  HCl 10 MG CAPS Take 1 capsule (10 mg total) by mouth daily for 15 days. Patient not taking: Reported on 07/31/2019 04/16/18 09/04/19  Wieters, Hallie C, PA-C  fluticasone  (FLONASE ) 50 MCG/ACT nasal spray Place 1 spray into both nostrils daily for 7 days. Patient not taking: Reported on 07/31/2019 04/16/18 09/04/19  Wieters, Hallie C, PA-C  lactase (LACTAID) 3000 UNITS tablet Take 1 tablet (3,000 Units total) by mouth 3 (three) times daily with meals. Patient not taking: Reported on 07/31/2019 08/10/12 09/04/19  Gretta Fairy DEL, MD  metoCLOPramide  (REGLAN ) 10 MG tablet Take 1 tablet (10 mg total) by mouth every 6 (six) hours. Patient not taking: Reported on 07/31/2019 04/28/16 09/04/19  Jamelle Lorrayne HERO, NP    Family History Family History  Problem Relation Age of Onset   Hypertension Mother    Diabetes Mother    Asthma Mother    Cancer Father     Social History Social History[1]   Allergies   Sulfa antibiotics   Review of Systems Review of Systems Per HPI  Physical Exam Triage Vital Signs ED Triage Vitals  Encounter Vitals Group     BP 01/05/25 1314 103/70     Girls Systolic BP Percentile --      Girls Diastolic BP Percentile --      Boys Systolic BP Percentile --      Boys Diastolic BP Percentile --      Pulse Rate 01/05/25 1314 (!) 110     Resp 01/05/25 1314 20     Temp 01/05/25 1314 99.6 F (37.6 C)     Temp Source 01/05/25 1314 Oral     SpO2 01/05/25 1314 98 %     Weight --      Height --      Head Circumference --      Peak Flow --      Pain Score 01/05/25 1313 4     Pain Loc --      Pain Education --      Exclude from Growth Chart --    No data found.  Updated Vital Signs BP  103/70 (BP Location: Right Arm)   Pulse (!) 110   Temp 99.6 F (37.6 C) (Oral)   Resp 20   LMP 12/19/2024 (Exact Date)   SpO2 98%   Visual Acuity Right Eye Distance:   Left Eye Distance:   Bilateral Distance:    Right Eye Near:   Left Eye Near:    Bilateral Near:     Physical Exam Constitutional:      General: She is not in acute distress.    Appearance: Normal appearance. She is not toxic-appearing or diaphoretic.  HENT:     Head: Normocephalic and atraumatic.     Right Ear: Tympanic membrane and ear canal normal.     Left Ear: Tympanic membrane and ear canal normal.     Nose: Congestion present.     Mouth/Throat:     Mouth: Mucous membranes are moist.     Pharynx: Posterior oropharyngeal erythema present. No pharyngeal swelling or oropharyngeal exudate.     Tonsils: No tonsillar exudate or tonsillar abscesses.  Eyes:     Extraocular Movements: Extraocular movements intact.     Conjunctiva/sclera: Conjunctivae normal.     Pupils: Pupils are equal, round, and reactive to light.  Cardiovascular:     Rate and Rhythm: Normal rate and regular rhythm.     Pulses: Normal pulses.     Heart sounds: Normal heart sounds.  Pulmonary:     Effort: Pulmonary effort is normal. No respiratory distress.     Breath sounds: Normal breath sounds. No stridor. No wheezing, rhonchi or rales.  Abdominal:     General: Bowel sounds are normal.     Palpations: Abdomen is soft.  Musculoskeletal:        General: Normal range of motion.     Cervical back: Normal range of motion.  Skin:    General: Skin is warm and dry.  Neurological:     General: No focal deficit present.     Mental Status: She is alert and oriented to person, place, and time. Mental status is at baseline.  Psychiatric:        Mood and Affect: Mood normal.        Behavior: Behavior normal.      UC Treatments / Results  Labs (all labs ordered are listed, but only abnormal results are displayed) Labs Reviewed  POC  SOFIA SARS ANTIGEN FIA - Abnormal; Notable for the following components:  Result Value   SARS Coronavirus 2 Ag Positive (*)    All other components within normal limits  CULTURE, GROUP A STREP [facility])  POCT RAPID STREP A (OFFICE)  POCT INFLUENZA A/B    EKG   Radiology No results found.  Procedures Procedures (including critical care time)  Medications Ordered in UC Medications - No data to display  Initial Impression / Assessment and Plan / UC Course  I have reviewed the triage vital signs and the nursing notes.  Pertinent labs & imaging results that were available during my care of the patient were reviewed by me and considered in my medical decision making (see chart for details).     Patient tested positive for Covid 19. Flu and strep tests are negative. Throat culture pending. Offered patient Covid antiviral but she declined.   Recommended symptom control with over the counter medications: Daily oral anti-histamine, Oral decongestant or IN corticosteroid, saline irrigations, cepacol lozenges, Robitussin, Delsym, honey tea.  Mild tachycardia noted in triage but suspect this is due to acute illness versus low grade temp. Heart rate appears normal on physical exam. Advised patient to push fluids. She was able to tolerate oral fluids while in exam room. Will defer ondansetron  as I do not think it will be helpful given patient reports nausea seems to be related to mucus.   Return if symptoms fail to improve in 1-2 weeks or you develop shortness of breath, chest pain, severe headache. Patient states understanding and is agreeable.  Discharged with PCP followup.  Final Clinical Impressions(s) / UC Diagnoses   Final diagnoses:  Sore throat  Acute cough  COVID-19     Discharge Instructions      Your covid test was positive today. This is a virus that will run its course with the help of symptomatic treatment.   1. For congestion you may try an oral decongestant like  Mucinex  or sudafed. You may also try intranasal flonase  nasal spray or saline irrigations (neti pot, sinus cleanse)  2. For your sore throat you may try cepacol lozenges, salt water gargles, throat spray. Treatment of congestion may also help your sore throat.  3. For cough you may try Robitussen, Mucinex  DM  6. Take Tylenol  or Ibuprofen to help with pain/inflammation  7. Stay hydrated, drink plenty of fluids to keep throat coated and less irritated  Honey Tea For cough/sore throat try using a honey-based tea. Use 3 teaspoons of honey with juice squeezed from half lemon. Place shaved pieces of ginger into 1/2-1 cup of water and warm over stove top. Then mix the ingredients and repeat every 4 hours as needed.       ED Prescriptions   None    PDMP not reviewed this encounter.    [1]  Social History Tobacco Use   Smoking status: Never    Passive exposure: Never   Smokeless tobacco: Never  Vaping Use   Vaping status: Former  Substance Use Topics   Alcohol use: Never   Drug use: Never     Hazen Darryle BRAVO, [place] 01/05/25 1352  "

## 2025-01-05 NOTE — Discharge Instructions (Addendum)
 Your covid test was positive today. This is a virus that will run its course with the help of symptomatic treatment.   1. For congestion you may try an oral decongestant like Mucinex  or sudafed. You may also try intranasal flonase  nasal spray or saline irrigations (neti pot, sinus cleanse)  2. For your sore throat you may try cepacol lozenges, salt water gargles, throat spray. Treatment of congestion may also help your sore throat.  3. For cough you may try Robitussen, Mucinex  DM  6. Take Tylenol  or Ibuprofen to help with pain/inflammation  7. Stay hydrated, drink plenty of fluids to keep throat coated and less irritated  Honey Tea For cough/sore throat try using a honey-based tea. Use 3 teaspoons of honey with juice squeezed from half lemon. Place shaved pieces of ginger into 1/2-1 cup of water and warm over stove top. Then mix the ingredients and repeat every 4 hours as needed.

## 2025-01-05 NOTE — ED Triage Notes (Signed)
 Pt c/o cough, sore throat,nausea x 3 days. Taking mucinex  and tylenol 

## 2025-01-08 LAB — CULTURE, GROUP A STREP (THRC)

## 2025-01-10 ENCOUNTER — Ambulatory Visit: Payer: Self-pay | Admitting: Internal Medicine

## 2025-01-10 MED ORDER — AMOXICILLIN 500 MG PO CAPS: 500.0000 mg | ORAL_CAPSULE | Freq: Two times a day (BID) | ORAL | 0 refills | Status: AC
